# Patient Record
Sex: Female | Born: 1965 | Race: White | Hispanic: No | Marital: Married | Smoking: Never smoker
Health system: Southern US, Community
[De-identification: ages and names within clinical notes are randomized; demographics above are authoritative.]

## PROBLEM LIST (undated history)

## (undated) DIAGNOSIS — I10 Essential (primary) hypertension: Secondary | ICD-10-CM

## (undated) HISTORY — PX: TONSILLECTOMY: SUR1361

## (undated) HISTORY — PX: ABDOMINAL HYSTERECTOMY: SHX81

---

## 1999-03-11 ENCOUNTER — Other Ambulatory Visit: Admission: RE | Admit: 1999-03-11 | Discharge: 1999-03-11 | Payer: Self-pay | Admitting: Gynecology

## 2000-05-05 ENCOUNTER — Other Ambulatory Visit: Admission: RE | Admit: 2000-05-05 | Discharge: 2000-05-05 | Payer: Self-pay | Admitting: Gynecology

## 2001-08-23 ENCOUNTER — Other Ambulatory Visit: Admission: RE | Admit: 2001-08-23 | Discharge: 2001-08-23 | Payer: Self-pay | Admitting: Gynecology

## 2002-04-29 ENCOUNTER — Encounter: Payer: Self-pay | Admitting: General Surgery

## 2002-04-29 ENCOUNTER — Ambulatory Visit (HOSPITAL_COMMUNITY): Admission: RE | Admit: 2002-04-29 | Discharge: 2002-04-29 | Payer: Self-pay | Admitting: General Surgery

## 2002-05-04 ENCOUNTER — Encounter: Payer: Self-pay | Admitting: General Surgery

## 2002-05-04 ENCOUNTER — Encounter (HOSPITAL_COMMUNITY): Admission: RE | Admit: 2002-05-04 | Discharge: 2002-06-03 | Payer: Self-pay | Admitting: General Surgery

## 2002-08-25 ENCOUNTER — Other Ambulatory Visit: Admission: RE | Admit: 2002-08-25 | Discharge: 2002-08-25 | Payer: Self-pay | Admitting: Gynecology

## 2003-01-17 ENCOUNTER — Inpatient Hospital Stay (HOSPITAL_COMMUNITY): Admission: AD | Admit: 2003-01-17 | Discharge: 2003-01-18 | Payer: Self-pay | Admitting: Gynecology

## 2003-01-17 ENCOUNTER — Encounter (INDEPENDENT_AMBULATORY_CARE_PROVIDER_SITE_OTHER): Payer: Self-pay | Admitting: Specialist

## 2003-03-29 ENCOUNTER — Encounter: Payer: Self-pay | Admitting: Emergency Medicine

## 2003-03-29 ENCOUNTER — Emergency Department (HOSPITAL_COMMUNITY): Admission: EM | Admit: 2003-03-29 | Discharge: 2003-03-29 | Payer: Self-pay | Admitting: Emergency Medicine

## 2003-11-07 ENCOUNTER — Other Ambulatory Visit: Admission: RE | Admit: 2003-11-07 | Discharge: 2003-11-07 | Payer: Self-pay | Admitting: Gynecology

## 2005-01-03 ENCOUNTER — Other Ambulatory Visit: Admission: RE | Admit: 2005-01-03 | Discharge: 2005-01-03 | Payer: Self-pay | Admitting: Gynecology

## 2005-01-20 ENCOUNTER — Encounter (INDEPENDENT_AMBULATORY_CARE_PROVIDER_SITE_OTHER): Payer: Self-pay | Admitting: *Deleted

## 2005-01-20 ENCOUNTER — Inpatient Hospital Stay (HOSPITAL_COMMUNITY): Admission: RE | Admit: 2005-01-20 | Discharge: 2005-01-21 | Payer: Self-pay | Admitting: Obstetrics & Gynecology

## 2006-02-05 ENCOUNTER — Other Ambulatory Visit: Admission: RE | Admit: 2006-02-05 | Discharge: 2006-02-05 | Payer: Self-pay | Admitting: Gynecology

## 2006-04-15 ENCOUNTER — Encounter: Admission: RE | Admit: 2006-04-15 | Discharge: 2006-07-14 | Payer: Self-pay | Admitting: Specialist

## 2006-06-02 ENCOUNTER — Encounter: Admission: RE | Admit: 2006-06-02 | Discharge: 2006-06-02 | Payer: Self-pay | Admitting: Gynecology

## 2007-03-29 ENCOUNTER — Encounter: Admission: RE | Admit: 2007-03-29 | Discharge: 2007-03-29 | Payer: Self-pay | Admitting: Gynecology

## 2009-08-17 ENCOUNTER — Encounter: Admission: RE | Admit: 2009-08-17 | Discharge: 2009-08-17 | Payer: Self-pay | Admitting: Gynecology

## 2010-07-16 ENCOUNTER — Other Ambulatory Visit: Payer: Self-pay | Admitting: Gynecology

## 2010-07-16 DIAGNOSIS — Z1239 Encounter for other screening for malignant neoplasm of breast: Secondary | ICD-10-CM

## 2010-07-17 ENCOUNTER — Other Ambulatory Visit: Payer: Self-pay | Admitting: Gynecology

## 2010-08-19 ENCOUNTER — Ambulatory Visit
Admission: RE | Admit: 2010-08-19 | Discharge: 2010-08-19 | Disposition: A | Discharge status: Home / Self Care | Payer: BC Managed Care – PPO | Source: Ambulatory Visit | Attending: Gynecology | Admitting: Gynecology

## 2010-08-19 DIAGNOSIS — Z1239 Encounter for other screening for malignant neoplasm of breast: Secondary | ICD-10-CM

## 2010-11-01 NOTE — H&P (Signed)
[name], [name] NO.:  [id number]   MEDICAL RECORD NO.:  [id number]          PATIENT TYPE:  INP   LOCATION:  1606                         FACILITY:  [facility]   PHYSICIAN:  Gretta Cool, M.D. DATE OF BIRTH:  July 05, 1965   DATE OF ADMISSION:  01/20/2005  DATE OF DISCHARGE:                                HISTORY & PHYSICAL   CHIEF COMPLAINT:  Incapacitating cyclic pelvic pain.   HISTORY OF PRESENT ILLNESS:  [name] has a long history of pelvic  inflammatory disease with her initial surgery in February 1998. She  subsequently had laparotomy, lysis of adhesions and left salpingo-  oophorectomy and right oophorocystectomy and 2004. 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 She has had repeated episodes of painful  ovulatory cysts. She has had to remain on hormonal contraception to suppress  her ovarian function to be able to tolerate the pain. She now presents for  definitive therapy by hysterectomy, salpingo-oophorectomy for her  incapacitating cyclic pelvic pain.   PAST MEDICAL HISTORY:  Usual childhood disease without sequelae.   MEDICAL ILLNESSES:  1.  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  2.  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   PRESENT MEDICATIONS:  1.  Nuvaring for contraception.  2.  Zoloft 100 mg daily for depression and anxiety.  3.  Trazodone 100 mg for sleep disorder.  4.  Valtrex 500 milligrams daily for suppression of HSV.   FAMILY HISTORY:  Mother and father are both  living and well. Father has  diabetes and blood pressure elevations, one sister is living and well. No  other known familial tendency to disease.   REVIEW OF SYSTEMS:  HEENT:  Denies symptoms. CARDIORESPIRATORY:  Denies  asthma, cough, bronchitis, shortness of breath. GI/GU:  Denies frequency,  urgency, dysuria change in bowel habits, food  intolerance.   PHYSICAL EXAMINATION:  GENERAL:  Well-developed, well-nourished white female  moderately over ideal weight at 174, 5 feet, 4-1/4 inches tall.  HEENT:  Pupils are equal and reactive to light and accommodate. Fundi not  examined. Oropharynx clear.  NECK:  Supple without mass or thyroid enlargement.  BREASTS:  Without masses, nodes or nipple discharge.  PELVIC:  External genitalia normal female, vagina clean and rugose. Cervix  is nulliparous. The uterus is mid position, normal size, shape, contour.  There is thickening and tenderness bilaterally.   IMPRESSION:  1.  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  2.  History of severe pelvic inflammatory disease with multiple procedures      as above.  3.  Depression.  4.  History of herpes simplex virus 2.  5.  Sleep disorder.  RECOMMENDATIONS:  Recommended on to definitive therapy by laparotomy,  hysterectomy, possible salpingo-oophorectomy, lysis of extensive adhesions,  bowel prep preoperatively.       CWL/MEDQ  D:  01/20/2005  T:  01/20/2005  Job:  [id number]   cc:   Dr. Lucien Mons

## 2010-11-01 NOTE — Op Note (Signed)
[name], OCHELTREE NO.:  [id number]   MEDICAL RECORD NO.:  [id number]          PATIENT TYPE:  INP   LOCATION:  1606                         FACILITY:  [facility]   PHYSICIAN:  Gretta Cool, M.D. 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   PROCEDURE:  Hysterectomy, right salpingo-oophorectomy, lysis of extensive  adhesions.   SURGEON:  Gretta Cool, M.D.   ASSISTANT:  Phyllis Ginger, M.D.   ANESTHESIA:  General orotracheal.   DESCRIPTION OF PROCEDURE:  Under excellent general anesthesia with the  patient prepped and draped in supine position with her bladder drained by  Foley catheter a Pfannenstiel incision was made by excision of her previous  Pfannenstiel scar. The incision was then extended through the fascia and the  rectus muscles separated in the midline. Upon opening the peritoneal cavity  there was extensive adhesions of the omentum all over the anterior abdominal  wall. After extended dissection the omentum was removed from the anterior  abdominal wall, peritoneum, and from the right pelvic peritoneum, bladder  flap, and posterior aspect of the uterus. Once all of these adhesions were  lysed her right tube was found to be fairly free and mobile. Her left tube  and ovary had been previously removed. At this point, decision was made to  proceed with hysterectomy. The uterus was elevated with Kelly clamps, the  round ligaments transected, and the anterior leaf of the broad ligament was  opened. The bladder was pushed off the lower segment. The infundibulopelvic  vessels on the right were then skeletonized. The  vessels were then clamped,  cut, sutured and tied with 0 Vicryl. At this point the uterine vessels  bilaterally were skeletonized. The left infundibulum pelvic had been  previously removed. The uterine vessels were then clamped, cut, sutured, and  tied with 0 Vicryl. The upper portion of cardinal ligaments were then also  clamped, cut, sutured, and tied. At this point the cervix was incised and  the cervical tissue largely excised. What was intended to be a supracervical  hysterectomy eventually removed virtually all of the endocervical canal and  left the fascia of the cervix but removed the endocervical canal. At this  point the anterior and posterior cervical vaginal fascia was approximated  with a running suture of 0 Vicryl. The cervix and uterus were removed. The  pelvic floor was then reperitonealized with a running suture of 2-0  Monocryl. At this point the packs and retractors were removed. There was no  significant bleeding. The abdominal peritoneum was then closed with a  running suture of 0 Monocryl. The rectus muscles were then plicated in the  midline with the same suture of 0 Monocryl. At this point the fascia was  irrigated and then approximated with a running suture of 0 Vicryl from each  angle to the midline, subcutaneous tissues approximated with interrupted  sutures of 3-0 Vicryl, and the skin closed with skin staples and Steri-  Strips. At the end of the procedure, sponge and lap counts were correct,  there were no complications. The patient returned to the recovery room in  excellent condition.       CWL/MEDQ  D:  01/20/2005  T:  01/20/2005  Job:  [id number]   cc:   Dr. Alessandra Bevels, [place], or [place], [place]

## 2010-11-01 NOTE — H&P (Signed)
NAME:  [name], [name]                      ACCOUNT NO.:  [id number]   MEDICAL RECORD NO.:  [id number]                   PATIENT TYPE:  INP   LOCATION:  9399                                 FACILITY:  [facility]   PHYSICIAN:  Gretta Cool, M.D.              DATE OF BIRTH:  03-27-1966   DATE OF ADMISSION:  01/17/2003  DATE OF DISCHARGE:                                HISTORY & PHYSICAL   CHIEF COMPLAINT:  Incapacitating cyclic pelvic pain.   HISTORY OF PRESENT ILLNESS:  [name] is a 45 year old nulligravida  with a history of recurrent pelvic inflammatory disease with bilateral  tuboovarian involvement.  She has had the diagnosis of right hydrosalpinx  for several years dating to 2001 with a consistent finding of left tortuous  enlargement of her left fallopian tube with adjacent ovarian structure.  Intermittently, she has had evidence of a trapped ovary syndrome well  suppressed by oral contraceptives.  She has recently been switched to  transdermal by Nuvaring.  She continues to miss work every month and  continues to be incapacitated by cyclic pelvic pain, apparently from her  right hydrosalpinx.  She continues to hold out hope for spontaneous  pregnancy.  We have discussed that any high hope of pregnancy would involve,  at best, reproductive technology with IVF.  She wishes to proceed with  definitive therapy to improve her comfort, but not with definitive therapy  such that she would not have an option of fertility.  At some time in the  future, she understands the remote possibility by other than IVF.  She also  has significant depression and sleep disorder, on medication as above with  trazodone and Zoloft.   PAST MEDICAL HISTORY:  Usual childhood disease without sequelae medical  illnesses, incapacitating cyclic pelvic pains.  Previous history of  exploratory laparotomy, lysis of adhesions February 1998 and repeat  diagnostic laparoscopy second look, lysis of  adhesions also May 1998.  She  has had repeated bouts of pelvic inflammatory disease prior to the above  procedures.   MEDICATIONS:  1. Zoloft 125 mg daily.  2. Trazodone 100 mg q.h.s. for sleep.  3. Xanax 0.5 as needed for anxiety.  4. __________ for suppression of follicular activity and cyclic pain.   FAMILY HISTORY:  Father is living with diabetes and hypertension.  Mother is  living and well.  Paternal grandmother had melanoma.  Maternal grandfather  died of stroke.  No other known familial tendency.   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   REVIEW OF SYSTEMS:  HEENT:  Denies symptoms.  RESPIRATORY:  Denies asthma,  cough, or shortness of breath.  GI/GU:  Denies frequency, urgency, dysuria,  change in bowel habits.  Abdominal discomfort, cyclic in nature as above.   PHYSICAL EXAMINATION:  GENERAL APPEARANCE:  Well-developed, well-nourished  white female, moderately over ideal weight at  165, 5 feet 4 inches, blood  pressure 90/60.  HEENT:  Pupils equal, reactive to light and accomodation.  Fundi not  examined.  Oropharynx clear.  NECK:  Supple without mass.  Thyroid enlarged.  CHEST:  Clear to P&A.  HEART:  Regular rhythm without murmurs.  BREASTS:  Without mass nodes or nipple discharge.  ABDOMEN:  Soft, scaphoid without mass or organomegaly.  PELVIC:  External genitalia, normal female, vagina clean and rugose.  The  cervix is nulliparous.  The uterus is mid position.  ON the right, there is  thickening and fullness, exquisite tenderness and a maximal enlargement.  On  the left, there is thickening and discomfort compatible with the ultrasound  findings noted above.  RECTOVAGINAL:  Confirmed.  EXTREMITIES:  Negative.  NEUROLOGICAL:  Physiologic.   IMPRESSION:  1. Pelvic inflammatory disease with right hydrosalpinx and recurrent     incapacitating cyclic pelvic pain.  2. Trapped ovary syndrome.  3. Depression on  multiple agent therapy.  4. Sleep disorder, on trazodone.   RECOMMENDATIONS:  I have recommended on to laparotomy and salpingo-  oophorectomy, lysis of adhesions in so far as possible.  Possible salpingo-  oophorectomy if the ovaries cannot be salvaged without recurring pain.  She  understands all of the plans and alternative therapies.                                               Gretta Cool, M.D.    CWL/MEDQ  D:  01/16/2003  T:  01/17/2003  Job:  [id number]   cc:   Pollyann Samples, M.D.  [place], [place]

## 2010-11-01 NOTE — Discharge Summary (Signed)
NAME:  [name], Petersen                      ACCOUNT NO.:  [id number]   MEDICAL RECORD NO.:  [id number]                   PATIENT TYPE:  INP   LOCATION:  9137                                 FACILITY:  [facility]   PHYSICIAN:  Gretta Cool, M.D.              DATE OF BIRTH:  1965/10/21   DATE OF ADMISSION:  01/17/2003  DATE OF DISCHARGE:  01/18/2003                                 DISCHARGE SUMMARY   HISTORY OF PRESENT ILLNESS:  [name] is a 45 year old female,  nulligravida with a history of recurrent pelvic inflammatory disease with  bilateral tuboovarian involvement.  She had a diagnosis of right  hydrosalpinx for several years dating to 2001 with consistent findings of a  left tortuous enlargement of her left fallopian tube with an adjacent  ovarian structure.  She also has evidence of trapped ovarian syndrome that  is well suppressed by oral contraceptives.  Recently, she has been switched  to transdermal contraceptive with NuvaRing.  She continues to report  incapacitating cyclic pelvic pain which is causing her to miss work.  It is  felt that this is from her right hydrosalpinx.  She continues to have hope  of a spontaneous pregnancy.  It has been discussed with the patient that it  would be best that she proceed with reproductive technology with IVF.  She  wishes at this point to proceed with definitive therapy to improve comfort  but not with definitive therapy such that she would not have the option of  fertility.  It is also noted that she has significant depression and sleep  disorder which she is being treated with trazodone and Zoloft.   ADMISSION PHYSICAL EXAMINATION:  VITAL SIGNS:  A well-developed, well-  nourished female moderately over ideal weight at 165 pounds, 5 feet 4  inches, blood pressure is 90/60.  CHEST:  Clear to A&P.  HEART:  Rate and rhythm are regular without murmur, gallop, or cardiac  enlargement.  BREASTS:  Without masses noted, no nipple  discharge.  ABDOMEN:  Soft and scaphoid without masses or organomegaly.  PELVIC:  External genitalia within normal limits for female.  Vagina clean  and rugosed.  Cervix is nulliparous.  Uterus is mid position.  On the right  there is thickening and fullness, exquisite tenderness, and maximum  enlargement.  On the left there is thickening and discomfort compatible with  the ultrasound findings.  Rectovaginal exam confirms.   IMPRESSION:  1. Pelvic inflammatory disease with right hydrosalpinx and recurrent     incapacitating cyclic pelvic pain.  2. Trapped ovary syndrome.  3. Depression on multiple agent therapy.  4. Sleep disorder on trazodone.   PLAN:  It has been recommended to the patient to proceed with laparotomy and  salpingo-oophorectomy, lysis of adhesions as much as possible.  Possible  salpingo-oophorectomy if the ovaries cannot be salvaged.  The patient  understands the plan and accepts these procedures.  LABORATORY DATA:  1. Pathology report showed right ovary, a cyst benign ovarian stroma with     benign fibromembranous cyst.  2. Left ovary and fallopian tube benign ovary with focus of endometriosis     and benign cystic follicles, benign fallopian tube with benign paratubal     cysts and fibrous adhesions.   HOSPITAL COURSE:  The patient underwent exploratory laparotomy, right  ovarian cystectomy, lysis of dense ovarian adhesions, and excision of left  trapped ovary under general anesthesia.  The procedures were completed  without any complications and the patient was returned to the recovery room  in excellent condition.  Postoperative course was without complications and  the patient was discharged on the first postoperative day in excellent  condition.   FINAL DISCHARGE INSTRUCTIONS:  Included no heavy lifting or straining, no  vaginal entrance, and to increase ambulation as tolerated.  She is to call  for any fever over 100.5 or failure of daily  improvement.   DIET:  Regular.   MEDICATIONS:  1. Dilaudid 2 mg p.o. q.4 h. p.r.n. discomfort.  2. She can return to her preoperative medications.   She is to return to the office in two weeks for followup.   CONDITION ON DISCHARGE:  Excellent.   FINAL DISCHARGE DIAGNOSES:  1. 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  2. Extensive left ovarian adhesions with severe trapped ovary syndrome with     tubal and ovarian adhesions.   PROCEDURES PERFORMED:  Exploratory laparotomy, right ovarian cystectomy,  lysis of dense ovarian adhesions, and excision of left trapped ovary under  general anesthesia.     Matt Holmes, N.P.                          Gretta Cool, M.D.    EMK/MEDQ  D:  04/06/2003  T:  04/06/2003  Job:  [id number]

## 2010-11-01 NOTE — Discharge Summary (Signed)
NAMEANALLELY, ROSELL NO.:  [id number]   MEDICAL RECORD NO.:  [id number]          PATIENT TYPE:  INP   LOCATION:  1606                         [facility]ACILITY:  [facility]   PHYSICIAN:  Gretta Cool, M.D. DATE OF BIRTH:  1965/10/07   DATE OF ADMISSION:  01/20/2005  DATE OF DISCHARGE:  01/21/2005                                 DISCHARGE SUMMARY   HISTORY OF PRESENT ILLNESS:  Ms. Mink has had a long history of pelvic  inflammatory disease with her initial surgery in February of 1998. She  subsequently had laparotomy, lysis of adhesions and left salpingo-  oophorectomy and right oophorocystectomy in 2004. She has continued to have  incapacitating cyclic pelvic pain with recurring adnexal masses thought to  be related to trapped ovarian syndrome. She has had repeated episodes of  painful ovulatory cysts. She has had to remain on hormonal contraceptive to  suppress her ovarian function to be able to tolerate the pain. She now  presents for definitive therapy by hysterectomy, salpingo-oophorectomy for  incapacitating cyclic pelvic pain.   ADMISSION EXAM:  CHEST:  Clear to A&P.  HEART:  Rate and rhythm were regular without murmur, gallop or cardiac  enlargement.  ABDOMEN:  Soft without masses or organomegaly.  PELVIC:  External genitalia within normal limits for female. Vagina clean  and rugose. Cervix is nulliparous and clean. The uterus is mid position,  normal shape, size and contour. There is thickening and tenderness  bilaterally.   IMPRESSION:  1.  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  2.  History of severe pelvic inflammatory disease with multiple procedures.  3.  Depression.  4.  History of HSV type 2.  5.  Sleep disorder.   PLAN:  Definitive therapy by laparotomy, hysterectomy, possible salpingo-  oophorectomy, lysis of extensive adhesions. Will have bowel prepped  preoperatively. The risks and  benefits have been discussed with the patient  and she accepts these procedures.   LABORATORY DATA:  Admission hemoglobin 13.8, hematocrit 40.2. On the first  postoperative day, hemoglobin was 13.1, hematocrit 37.8.   HOSPITAL COURSE:  The patient underwent hysterectomy, right salpingo-  oophorectomy, lysis of extensive adhesions under general anesthesia. The  procedures were completed without any complication and the patient was  returned to the recovery room in excellent condition.   PATHOLOGY REPORT:  Cervix marked acute cervicitis. Endometrium, secretory  endometrium, no evidence of hyperplasia, malignancy or features of polyp.  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  Unremarkable fallopian tube and ovary with cystic follicles. Her  postoperative course was without complications and she was discharged on the  second postoperative day in excellent condition.   FINAL DISCHARGE INSTRUCTIONS:  No heavy lifting or straining, no vaginal  entrance, and increase her daily activities as tolerated.   MEDICATIONS:  1.  Celebrex 200 mg daily x4.  2.  Darvocet-N 100, 1 p.o. q.4 h p.r.n. discomfort.   DIET:  Regular.   She is to return to the office in one  week for followup.   CONDITION ON DISCHARGE:  Excellent.   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   PROCEDURE PERFORMED:  Hysterectomy, right salpingo-oophorectomy, lysis of  extensive adhesions under general anesthesia.      Matt Holmes, N.P.    ______________________________  Gretta Cool, M.D.    EMK/MEDQ  D:  03/19/2005  T:  03/19/2005  Job:  [id number]

## 2010-11-01 NOTE — Op Note (Signed)
NAME:  [name], [name]                      ACCOUNT NO.:  [id number]   MEDICAL RECORD NO.:  [id number]                   PATIENT TYPE:  INP   LOCATION:  9137                                 FACILITY:  [facility]   PHYSICIAN:  Gretta Cool, M.D.              DATE OF BIRTH:  October 08, 1965   DATE OF PROCEDURE:  01/17/2003  DATE OF DISCHARGE:                                 OPERATIVE REPORT   PREOPERATIVE DIAGNOSES:  1. Right adnexal mass with incapacitating cyclic pain.  2. Bilateral trapped ovary syndrome with history of recurrent pelvic     inflammatory disease.   POSTOPERATIVE DIAGNOSES:  1. 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  2. Extensive left ovarian adhesions with severe trapped ovary syndrome, with     tubal and ovarian adhesions.   PROCEDURES:  1. Exploratory laparotomy.  2. Right ovarian cystectomy.  3. Lysis of dense ovarian adhesions.  4. Excision of left trapped ovary.   ANESTHESIA:  General orotracheal.   SURGEON:  Gretta Cool, M.D.   ASSISTANT:  Raynald Kemp, M.D.   DESCRIPTION OF PROCEDURE:  Under excellent general anesthesia, the patient  prepped and draped in lithotomy position.  A Pfannenstiel incision was made  and extended through the fascia.  The rectus muscles were separated in the  midline and the abdomen explored.  No abnormalities identified in the upper  abdomen.  Examination of the pelvis revealed a huge cystic mass on the right  that has been followed for years by ultrasound and stable over time.  Her  left ovary was densely adherent to omentum and to the posterior aspect of  the uterus.  The fallopian tube was also adherent in those adhesions,  adherent to the same structures.  The procedure was begun by ovarian  cystectomy on the right.  The capsule was opened and the mass shelled out of  the ovary.  It is possible that the mass had been intermittently torsing,  resulting in ischemia of the ovary because of its very  small pedicle.  The  cyst was evacuated and blood supply cauterized at the base.  Bleeding points  were individually cauterized.  At this point the capsule of the ovary was  closed deep with 4-0 Vicryl suture and a subcuticular Vicryl suture, also a  4-0 Vicryl.  At this point all the bleeding was well-controlled.  There were  no significant adhesions of the right fallopian tube or ovary.  See the  previous description of lysis of adhesions on both ovaries and tubes.  On  the left because of the severity of adhesions and completely occluded  ovarian surface, it was felt that the ovary would again be a source of pain  intolerable to the patient and that it had no functional value.  At this  point a decision was made to remove the tube and ovary on the left.  The  infundibulopelvic vessels were skeletonized,  the vessels clamped, cut,  sutured, and tied with 0 Vicryl, and a free tie was used to doubly ligate  the pedicle.  The ovarian ligament was also clamped, cut, sutured, and tied  with 0 Vicryl.  At this point the round ligament was pulled around and  secured over the pedicle so as to prevent adhesion formation.  At this point  the pelvis was irrigated and all the incisions re-examined.  There was no  bleeding from any site.  The packs were removed, the pelvis irrigated once  again, and the abdominal peritoneum closed with running suture of 0  Monocryl.  The rectus muscles were also plicated in the midline with 0  Monocryl.  At this point the fascia was approximated from each angle to the  midline with a running suture of 0 Vicryl.  The subcutaneous tissue was  approximated with interrupted sutures of 3-0 Vicryl and the skin closed with  skin staples and Steri-Strips.  At the end of the procedure sponge and lap  counts were correct.  No complications.  The patient returned to the  recovery room in excellent condition.                                                Gretta Cool,  M.D.    CWL/MEDQ  D:  01/17/2003  T:  01/17/2003  Job:  [id number]

## 2011-09-15 ENCOUNTER — Other Ambulatory Visit: Payer: Self-pay | Admitting: Gynecology

## 2012-06-28 ENCOUNTER — Other Ambulatory Visit: Payer: Self-pay | Admitting: Gynecology

## 2012-10-20 ENCOUNTER — Other Ambulatory Visit: Payer: Self-pay

## 2013-03-30 ENCOUNTER — Other Ambulatory Visit: Payer: Self-pay

## 2013-03-30 DIAGNOSIS — Z1231 Encounter for screening mammogram for malignant neoplasm of breast: Secondary | ICD-10-CM

## 2013-04-25 ENCOUNTER — Ambulatory Visit
Admission: RE | Admit: 2013-04-25 | Discharge: 2013-04-25 | Disposition: A | Discharge status: Home / Self Care | Payer: BC Managed Care – PPO | Source: Ambulatory Visit

## 2013-04-25 DIAGNOSIS — Z1231 Encounter for screening mammogram for malignant neoplasm of breast: Secondary | ICD-10-CM

## 2014-12-11 ENCOUNTER — Other Ambulatory Visit: Payer: Self-pay

## 2014-12-11 DIAGNOSIS — Z1231 Encounter for screening mammogram for malignant neoplasm of breast: Secondary | ICD-10-CM

## 2015-01-11 ENCOUNTER — Ambulatory Visit
Admission: RE | Admit: 2015-01-11 | Discharge: 2015-01-11 | Disposition: A | Discharge status: Home / Self Care | Payer: BLUE CROSS/BLUE SHIELD | Source: Ambulatory Visit

## 2015-01-11 DIAGNOSIS — Z1231 Encounter for screening mammogram for malignant neoplasm of breast: Secondary | ICD-10-CM

## 2016-11-04 ENCOUNTER — Other Ambulatory Visit: Payer: Self-pay | Admitting: Obstetrics and Gynecology

## 2016-11-04 DIAGNOSIS — Z1231 Encounter for screening mammogram for malignant neoplasm of breast: Secondary | ICD-10-CM

## 2016-11-17 ENCOUNTER — Ambulatory Visit
Admission: RE | Admit: 2016-11-17 | Discharge: 2016-11-17 | Disposition: A | Discharge status: Home / Self Care | Payer: BLUE CROSS/BLUE SHIELD | Source: Ambulatory Visit | Attending: Obstetrics and Gynecology | Admitting: Obstetrics and Gynecology

## 2016-11-17 ENCOUNTER — Encounter: Payer: Self-pay | Admitting: Radiology

## 2016-11-17 DIAGNOSIS — Z1231 Encounter for screening mammogram for malignant neoplasm of breast: Secondary | ICD-10-CM

## 2017-04-20 DIAGNOSIS — L638 Other alopecia areata: Secondary | ICD-10-CM | POA: Diagnosis not present

## 2017-04-20 DIAGNOSIS — L710 Perioral dermatitis: Secondary | ICD-10-CM | POA: Diagnosis not present

## 2017-06-11 DIAGNOSIS — L638 Other alopecia areata: Secondary | ICD-10-CM | POA: Diagnosis not present

## 2017-07-03 ENCOUNTER — Emergency Department (HOSPITAL_COMMUNITY)
Admission: EM | Admit: 2017-07-03 | Discharge: 2017-07-03 | Disposition: A | Discharge status: Home / Self Care | Payer: BLUE CROSS/BLUE SHIELD | Attending: Emergency Medicine | Admitting: Emergency Medicine

## 2017-07-03 ENCOUNTER — Encounter (HOSPITAL_COMMUNITY): Payer: Self-pay | Admitting: Emergency Medicine

## 2017-07-03 ENCOUNTER — Emergency Department (HOSPITAL_COMMUNITY): Payer: BLUE CROSS/BLUE SHIELD

## 2017-07-03 DIAGNOSIS — Z79899 Other long term (current) drug therapy: Secondary | ICD-10-CM | POA: Insufficient documentation

## 2017-07-03 DIAGNOSIS — B009 Herpesviral infection, unspecified: Secondary | ICD-10-CM

## 2017-07-03 DIAGNOSIS — R110 Nausea: Secondary | ICD-10-CM | POA: Diagnosis not present

## 2017-07-03 DIAGNOSIS — K529 Noninfective gastroenteritis and colitis, unspecified: Secondary | ICD-10-CM

## 2017-07-03 DIAGNOSIS — I10 Essential (primary) hypertension: Secondary | ICD-10-CM | POA: Diagnosis not present

## 2017-07-03 DIAGNOSIS — R109 Unspecified abdominal pain: Secondary | ICD-10-CM | POA: Diagnosis not present

## 2017-07-03 DIAGNOSIS — R1030 Lower abdominal pain, unspecified: Secondary | ICD-10-CM | POA: Diagnosis not present

## 2017-07-03 DIAGNOSIS — R197 Diarrhea, unspecified: Secondary | ICD-10-CM | POA: Diagnosis not present

## 2017-07-03 HISTORY — DX: Essential (primary) hypertension: I10

## 2017-07-03 LAB — CBC WITH DIFFERENTIAL/PLATELET
Basophils Absolute: 0 10*3/uL (ref 0.0–0.1)
Basophils Relative: 0 %
EOS ABS: 0 10*3/uL (ref 0.0–0.7)
Eosinophils Relative: 0 %
HEMATOCRIT: 44.4 % (ref 36.0–46.0)
HEMOGLOBIN: 14.6 g/dL (ref 12.0–15.0)
LYMPHS ABS: 2.7 10*3/uL (ref 0.7–4.0)
LYMPHS PCT: 23 %
MCH: 30.2 pg (ref 26.0–34.0)
MCHC: 32.9 g/dL (ref 30.0–36.0)
MCV: 91.9 fL (ref 78.0–100.0)
MONOS PCT: 5 %
Monocytes Absolute: 0.6 10*3/uL (ref 0.1–1.0)
NEUTROS ABS: 8.3 10*3/uL — AB (ref 1.7–7.7)
NEUTROS PCT: 72 %
Platelets: 305 10*3/uL (ref 150–400)
RBC: 4.83 MIL/uL (ref 3.87–5.11)
RDW: 12.1 % (ref 11.5–15.5)
WBC: 11.7 10*3/uL — AB (ref 4.0–10.5)

## 2017-07-03 LAB — COMPREHENSIVE METABOLIC PANEL
ALBUMIN: 4.6 g/dL (ref 3.5–5.0)
ALK PHOS: 46 U/L (ref 38–126)
ALT: 18 U/L (ref 14–54)
ANION GAP: 12 (ref 5–15)
AST: 47 U/L — ABNORMAL HIGH (ref 15–41)
BUN: 15 mg/dL (ref 6–20)
CALCIUM: 9.4 mg/dL (ref 8.9–10.3)
CHLORIDE: 102 mmol/L (ref 101–111)
CO2: 26 mmol/L (ref 22–32)
Creatinine, Ser: 0.81 mg/dL (ref 0.44–1.00)
GFR calc non Af Amer: >60 mL/min (ref >60)
GLUCOSE: 98 mg/dL (ref 65–99)
Potassium: 3.4 mmol/L — ABNORMAL LOW (ref 3.5–5.1)
SODIUM: 140 mmol/L (ref 135–145)
Total Bilirubin: 0.8 mg/dL (ref 0.3–1.2)
Total Protein: 7.3 g/dL (ref 6.5–8.1)

## 2017-07-03 LAB — URINALYSIS, ROUTINE W REFLEX MICROSCOPIC
BILIRUBIN URINE: NEGATIVE
Glucose, UA: NEGATIVE mg/dL
HGB URINE DIPSTICK: NEGATIVE
Ketones, ur: NEGATIVE mg/dL
Leukocytes, UA: NEGATIVE
Nitrite: NEGATIVE
PH: 6 (ref 5.0–8.0)
Protein, ur: NEGATIVE mg/dL
SPECIFIC GRAVITY, URINE: 1.02 (ref 1.005–1.030)

## 2017-07-03 LAB — LIPASE, BLOOD: Lipase: 32 U/L (ref 11–51)

## 2017-07-03 LAB — POC OCCULT BLOOD, ED: FECAL OCCULT BLD: POSITIVE — AB

## 2017-07-03 MED ORDER — MORPHINE SULFATE (PF) 4 MG/ML IV SOLN
4.0000 mg | Freq: Once | INTRAVENOUS | Status: DC
  Filled 2017-07-03: qty 1

## 2017-07-03 MED ORDER — CIPROFLOXACIN HCL 500 MG PO TABS: 500.0000 mg | ORAL_TABLET | Freq: Two times a day (BID) | ORAL | 0 refills | Status: DC

## 2017-07-03 MED ORDER — METRONIDAZOLE 500 MG PO TABS: 500.0000 mg | ORAL_TABLET | Freq: Two times a day (BID) | ORAL | 0 refills | Status: DC

## 2017-07-03 MED ORDER — VALACYCLOVIR HCL 500 MG PO TABS
500.0000 mg | ORAL_TABLET | Freq: Once | ORAL | Status: AC
  Administered 2017-07-03: 500 mg via ORAL
  Filled 2017-07-03: qty 1

## 2017-07-03 MED ORDER — METRONIDAZOLE 500 MG PO TABS
500.0000 mg | ORAL_TABLET | Freq: Once | ORAL | Status: AC
  Administered 2017-07-03: 500 mg via ORAL
  Filled 2017-07-03: qty 1

## 2017-07-03 MED ORDER — VALACYCLOVIR HCL 500 MG PO TABS: 500.0000 mg | ORAL_TABLET | Freq: Two times a day (BID) | ORAL | 0 refills | Status: AC

## 2017-07-03 MED ORDER — ONDANSETRON HCL 4 MG/2ML IJ SOLN
4.0000 mg | Freq: Once | INTRAMUSCULAR | Status: AC
  Administered 2017-07-03: 4 mg via INTRAVENOUS
  Filled 2017-07-03: qty 2

## 2017-07-03 MED ORDER — TRAMADOL HCL 50 MG PO TABS: 50.0000 mg | ORAL_TABLET | Freq: Four times a day (QID) | ORAL | 0 refills | Status: DC | PRN

## 2017-07-03 MED ORDER — TRAMADOL HCL 50 MG PO TABS
50.0000 mg | ORAL_TABLET | Freq: Once | ORAL | Status: AC
  Administered 2017-07-03: 50 mg via ORAL
  Filled 2017-07-03: qty 1

## 2017-07-03 MED ORDER — CIPROFLOXACIN HCL 250 MG PO TABS
500.0000 mg | ORAL_TABLET | Freq: Once | ORAL | Status: AC
  Administered 2017-07-03: 500 mg via ORAL
  Filled 2017-07-03: qty 2

## 2017-07-03 MED ORDER — IOPAMIDOL (ISOVUE-300) INJECTION 61%
100.0000 mL | Freq: Once | INTRAVENOUS | Status: AC | PRN
  Administered 2017-07-03: 100 mL via INTRAVENOUS

## 2017-07-03 NOTE — ED Triage Notes (Signed)
Pt states she woke up at 10pm with diarrhea and abd pain/lower back pain.  States the diarrhea became bloody around 5am.  Nausea with no vomiting.

## 2017-07-03 NOTE — ED Provider Notes (Signed)
[facility] EMERGENCY DEPARTMENT Provider Note   CSN: [id number] Arrival date & time: 07/03/17  0725     History   Chief Complaint Chief Complaint  Patient presents with  . Diarrhea    HPI [name] is a 52 y.o. female with a history of hypertension and surgical history significant for abdominal hysterectomy presenting with a 10-hour history of diarrhea which has progressed to include blood-tinged stool.  She reports approximately 8 episodes of diarrhea starting around 8 PM last night.  In addition she has lower abdominal cramping pain which waxes and wanes, improved some after bowel movement.  Her last movement was about 1/2 hours ago and she reports seeing bright red blood at the bottom of the toilet bowl with this movement.  She denies fevers or chills, dizziness or weakness, she felt nauseated earlier but not currently.  She denies history of hemorrhoids, has not undergone screening colonoscopy yet. Denies foreign travel or recent abx use.   No treatments prior to arrival.  The history is provided by the patient.    Past Medical History:  Diagnosis Date  . Hypertension     There are no active problems to display for this patient.   Past Surgical History:  Procedure Laterality Date  . ABDOMINAL HYSTERECTOMY    . TONSILLECTOMY      OB History    No data available       Home Medications    Prior to Admission medications   Medication Sig Start Date End Date Taking? Authorizing Provider  ALPRAZolam Prudy Feeler) 0.5 MG tablet Take 0.5 mg by mouth 2 (two) times daily.   Yes [provider]  benazepril-hydrochlorthiazide (LOTENSIN HCT) 20-12.5 MG tablet Take 1 tablet by mouth daily.   Yes [provider]  clobetasol (TEMOVATE) 0.05 % external solution Apply 1 application topically 2 (two) times daily.   Yes [provider]  ciprofloxacin (CIPRO) 500 MG tablet Take 1 tablet (500 mg total) by mouth every 12 (twelve) hours. 07/03/17   Burgess Amor, PA-C  metroNIDAZOLE (FLAGYL) 500 MG tablet Take 1 tablet (500 mg total) by mouth 2 (two) times daily. 07/03/17   Burgess Amor, PA-C  traMADol (ULTRAM) 50 MG tablet Take 1 tablet (50 mg total) by mouth every 6 (six) hours as needed for moderate pain. 07/03/17   Elynor Kallenberger, Raynelle Fanning, PA-C  valACYclovir (VALTREX) 500 MG tablet Take 1 tablet (500 mg total) by mouth 2 (two) times daily for 5 days. 07/03/17 07/08/17  Burgess Amor, PA-C    Family History History reviewed. No pertinent family history.  Social History Social History   Tobacco Use  . Smoking status: Never Smoker  Substance Use Topics  . Alcohol use: No    Frequency: Never  . Drug use: No     Allergies   Morphine and Sulfa antibiotics   Review of Systems Review of Systems  Constitutional: Negative for fever.  HENT: Negative for congestion and sore throat.   Eyes: Negative.   Respiratory: Negative for chest tightness and shortness of breath.   Cardiovascular: Negative for chest pain.  Gastrointestinal: Positive for abdominal pain, blood in stool, diarrhea and nausea.  Genitourinary: Negative.   Musculoskeletal: Negative for arthralgias, joint swelling and neck pain.  Skin: Negative.  Negative for rash and wound.  Neurological: Negative for dizziness, weakness, light-headedness, numbness and headaches.  Psychiatric/Behavioral: Negative.      Physical Exam Updated Vital Signs BP 132/73 (BP Location: Right Arm)   Pulse 63   Temp  97.7 F (36.5 C) (Oral)   Resp 18   Ht 5\' 3"  (1.6 m)   Wt 72.1 kg (159 lb)   SpO2 100%   BMI 28.17 kg/m   Physical Exam  Constitutional: She appears well-developed and well-nourished.  HENT:  Head: Normocephalic and atraumatic.  Eyes: Conjunctivae are normal.  Neck: Normal range of motion.  Cardiovascular: Normal rate, regular rhythm, normal heart sounds and intact distal pulses.  Pulmonary/Chest: Effort normal and breath sounds normal. She has no wheezes.  Abdominal: Soft. Bowel sounds  are decreased. There is tenderness in the suprapubic area and left lower quadrant. There is no rigidity, no rebound and no guarding.  Genitourinary: Rectal exam shows guaiac positive stool.  Genitourinary Comments: Strongly hemoccult positive.  Chaperone present Banker)  Musculoskeletal: Normal range of motion.  Neurological: She is alert.  Skin: Skin is warm and dry.  Psychiatric: She has a normal mood and affect.  Nursing note and vitals reviewed.    ED Treatments / Results  Labs (all labs ordered are listed, but only abnormal results are displayed) Labs Reviewed  COMPREHENSIVE METABOLIC PANEL - Abnormal; Notable for the following components:      Result Value   Potassium 3.4 (*)    AST 47 (*)    All other components within normal limits  CBC WITH DIFFERENTIAL/PLATELET - Abnormal; Notable for the following components:   WBC 11.7 (*)    Neutro Abs 8.3 (*)    All other components within normal limits  URINALYSIS, ROUTINE W REFLEX MICROSCOPIC - Abnormal; Notable for the following components:   Color, Urine COLORLESS (*)    All other components within normal limits  POC OCCULT BLOOD, ED - Abnormal; Notable for the following components:   Fecal Occult Bld POSITIVE (*)    All other components within normal limits  LIPASE, BLOOD  OCCULT BLOOD X 1 CARD TO LAB, STOOL    EKG  EKG Interpretation None       Radiology Ct Abdomen Pelvis W Contrast  Result Date: 07/03/2017 CLINICAL DATA:  Bloody diarrhea and lower abdominal pain. EXAM: CT ABDOMEN AND PELVIS WITH CONTRAST TECHNIQUE: Multidetector CT imaging of the abdomen and pelvis was performed using the standard protocol following bolus administration of intravenous contrast. CONTRAST:  ISOVUE-300 IOPAMIDOL (ISOVUE-300) INJECTION 61% COMPARISON:  None. FINDINGS: Lower chest: No acute abnormality. Hepatobiliary: Subcentimeter low-density lesion in the right liver is too small to characterize. Focal fat along the falciform ligament.  Gallbladder is unremarkable. No biliary dilatation. Pancreas: Unremarkable. No pancreatic ductal dilatation or surrounding inflammatory changes. Spleen: Normal in size without focal abnormality. Adrenals/Urinary Tract: Adrenal glands are unremarkable. Kidneys are normal, without renal calculi, focal lesion, or hydronephrosis. Bladder is unremarkable. Stomach/Bowel: Moderate circumferential wall thickening of the descending colon with minimal surrounding inflammatory changes. The stomach and small bowel are unremarkable. No obstruction. Normal appendix. Vascular/Lymphatic: No significant vascular findings are present. No enlarged abdominal or pelvic lymph nodes. Reproductive: Status post hysterectomy. No adnexal masses. Other: No free fluid or pneumoperitoneum. Musculoskeletal: No acute or significant osseous findings. Mild levoscoliosis of the lumbar spine. IMPRESSION: 1. Moderate circumferential wall thickening of the descending colon, consistent with colitis. No colonic diverticula. Electronically Signed   By: Obie Dredge M.D.   On: 07/03/2017 11:30    Procedures Procedures (including critical care time)  Medications Ordered in ED Medications  morphine 4 MG/ML injection 4 mg (4 mg Intravenous Refused 07/03/17 0901)  metroNIDAZOLE (FLAGYL) tablet 500 mg (not administered)  ondansetron (ZOFRAN) injection  4 mg (4 mg Intravenous Given 07/03/17 0858)  valACYclovir (VALTREX) tablet 500 mg (500 mg Oral Given 07/03/17 1217)  iopamidol (ISOVUE-300) 61 % injection 100 mL (100 mLs Intravenous Contrast Given 07/03/17 1113)  traMADol (ULTRAM) tablet 50 mg (50 mg Oral Given 07/03/17 1219)  ciprofloxacin (CIPRO) tablet 500 mg (500 mg Oral Given 07/03/17 1219)     Initial Impression / Assessment and Plan / ED Course  I have reviewed the triage vital signs and the nursing notes.  Pertinent labs & imaging results that were available during my care of the patient were reviewed by me and considered in my medical  decision making (see chart for details).     During ed visit, pt pointed out a small patch of rash on her left buttock that has been diagnosed by her gyn as a herpes.  Last flare approx 1.5 years ago, now has had 2 back to back flares since Christmas.  Out of valtrex. Has asked for refill prescription.  Given dose here and script.  Labs and imaging reviewed and discussed with patient.  Patient had one small bowel movement while here, with small amount of bright red blood.  She was stable at time of discharge.  Will be placed on Cipro and Flagyl cautions discussed.  Referred to GI for evaluation.  Patient discussed with Dr. Ranae PalmsYelverton prior to discharge home.   Final Clinical Impressions(s) / ED Diagnoses   Final diagnoses:  Herpes  Colitis    ED Discharge Orders        Ordered    ciprofloxacin (CIPRO) 500 MG tablet  Every 12 hours     07/03/17 1215    metroNIDAZOLE (FLAGYL) 500 MG tablet  2 times daily     07/03/17 1215    valACYclovir (VALTREX) 500 MG tablet  2 times daily     07/03/17 1215    traMADol (ULTRAM) 50 MG tablet  Every 6 hours PRN     07/03/17 1215       Burgess Amordol, Makailee, PA-C 07/03/17 1220    Loren RacerYelverton, David, MD 07/03/17 1439

## 2017-07-03 NOTE — ED Notes (Signed)
ED Provider at bedside. 

## 2017-07-03 NOTE — Discharge Instructions (Signed)
Take the entire course of the antibiotics prescribed.  You have also been prescribed Valtrex at your request for your current outbreak.  Maintain a clear liquid diet for the next 24 hours, you may then increase your dietary choices if your symptoms allow.  Do not take any antidiarrheal medications at this time.  Return here as discussed if your symptoms worsen.

## 2017-07-20 DIAGNOSIS — Z6828 Body mass index (BMI) 28.0-28.9, adult: Secondary | ICD-10-CM | POA: Diagnosis not present

## 2017-07-20 DIAGNOSIS — I10 Essential (primary) hypertension: Secondary | ICD-10-CM | POA: Diagnosis not present

## 2017-07-20 DIAGNOSIS — F411 Generalized anxiety disorder: Secondary | ICD-10-CM | POA: Diagnosis not present

## 2017-09-07 DIAGNOSIS — H6123 Impacted cerumen, bilateral: Secondary | ICD-10-CM | POA: Diagnosis not present

## 2017-09-07 DIAGNOSIS — R42 Dizziness and giddiness: Secondary | ICD-10-CM | POA: Diagnosis not present

## 2017-09-07 DIAGNOSIS — H8111 Benign paroxysmal vertigo, right ear: Secondary | ICD-10-CM | POA: Diagnosis not present

## 2017-09-07 DIAGNOSIS — H6121 Impacted cerumen, right ear: Secondary | ICD-10-CM | POA: Insufficient documentation

## 2017-11-04 ENCOUNTER — Other Ambulatory Visit: Payer: Self-pay | Admitting: Obstetrics and Gynecology

## 2017-11-04 DIAGNOSIS — Z1231 Encounter for screening mammogram for malignant neoplasm of breast: Secondary | ICD-10-CM

## 2017-11-05 DIAGNOSIS — N9411 Superficial (introital) dyspareunia: Secondary | ICD-10-CM | POA: Diagnosis not present

## 2017-11-05 DIAGNOSIS — Z130 Encounter for screening for diseases of the blood and blood-forming organs and certain disorders involving the immune mechanism: Secondary | ICD-10-CM | POA: Diagnosis not present

## 2017-11-05 DIAGNOSIS — B009 Herpesviral infection, unspecified: Secondary | ICD-10-CM | POA: Diagnosis not present

## 2017-11-05 DIAGNOSIS — Z6829 Body mass index (BMI) 29.0-29.9, adult: Secondary | ICD-10-CM | POA: Diagnosis not present

## 2017-11-05 DIAGNOSIS — Z1389 Encounter for screening for other disorder: Secondary | ICD-10-CM | POA: Diagnosis not present

## 2017-11-05 DIAGNOSIS — Z113 Encounter for screening for infections with a predominantly sexual mode of transmission: Secondary | ICD-10-CM | POA: Diagnosis not present

## 2017-11-05 DIAGNOSIS — Z01419 Encounter for gynecological examination (general) (routine) without abnormal findings: Secondary | ICD-10-CM | POA: Diagnosis not present

## 2017-11-05 DIAGNOSIS — N951 Menopausal and female climacteric states: Secondary | ICD-10-CM | POA: Diagnosis not present

## 2017-11-10 ENCOUNTER — Encounter: Payer: Self-pay | Admitting: Internal Medicine

## 2017-11-25 ENCOUNTER — Ambulatory Visit
Admission: RE | Admit: 2017-11-25 | Discharge: 2017-11-25 | Disposition: A | Discharge status: Home / Self Care | Payer: BLUE CROSS/BLUE SHIELD | Source: Ambulatory Visit | Attending: Obstetrics and Gynecology | Admitting: Obstetrics and Gynecology

## 2017-11-25 DIAGNOSIS — Z1231 Encounter for screening mammogram for malignant neoplasm of breast: Secondary | ICD-10-CM

## 2018-01-18 DIAGNOSIS — F411 Generalized anxiety disorder: Secondary | ICD-10-CM | POA: Diagnosis not present

## 2018-01-18 DIAGNOSIS — Z6828 Body mass index (BMI) 28.0-28.9, adult: Secondary | ICD-10-CM | POA: Diagnosis not present

## 2018-01-18 DIAGNOSIS — I10 Essential (primary) hypertension: Secondary | ICD-10-CM | POA: Diagnosis not present

## 2018-01-18 DIAGNOSIS — J302 Other seasonal allergic rhinitis: Secondary | ICD-10-CM | POA: Diagnosis not present

## 2018-02-08 ENCOUNTER — Encounter: Payer: Self-pay | Admitting: *Deleted

## 2018-02-08 ENCOUNTER — Other Ambulatory Visit: Payer: Self-pay | Admitting: *Deleted

## 2018-02-08 ENCOUNTER — Ambulatory Visit: Payer: BLUE CROSS/BLUE SHIELD | Admitting: Nurse Practitioner

## 2018-02-08 ENCOUNTER — Encounter: Payer: Self-pay | Admitting: Nurse Practitioner

## 2018-02-08 DIAGNOSIS — K5900 Constipation, unspecified: Secondary | ICD-10-CM

## 2018-02-08 DIAGNOSIS — R1030 Lower abdominal pain, unspecified: Secondary | ICD-10-CM

## 2018-02-08 DIAGNOSIS — R109 Unspecified abdominal pain: Secondary | ICD-10-CM | POA: Insufficient documentation

## 2018-02-08 DIAGNOSIS — R935 Abnormal findings on diagnostic imaging of other abdominal regions, including retroperitoneum: Secondary | ICD-10-CM | POA: Insufficient documentation

## 2018-02-08 MED ORDER — PEG 3350-KCL-NA BICARB-NACL 420 G PO SOLR: 4000.0000 mL | Freq: Once | ORAL | 0 refills | Status: AC

## 2018-02-08 NOTE — Patient Instructions (Signed)
1. Take Colace 100 mg once a day (available over-the-counter).   2. You can use MiraLAX powder, 1-2 doses a day as needed for constipation despite taking Colace daily. 3. We will schedule your colonoscopy for you. 4. Return for follow-up in 4 months. 5. Call us if you have any questions or concerns.  At [facility] we value your feedback. You may receive a survey about your visit today. Please share your experience as we strive to create trusting relationships with our patients to provide genuine, compassionate, quality care.  We appreciate your understanding and patience as we review any laboratory studies, imaging, and other diagnostic tests that are ordered as we care for you. Our office policy is 5 business days for review of these results, and any emergent or urgent results are addressed in a timely manner for your best interest. If you do not hear from our office in 1 week, please contact us.   We also encourage the use of MyChart, which contains your medical information for your review as well. If you are not enrolled in this feature, an access code is available on this after visit summary for your convenience. Thank you for allowing us to be involved in the care of you and your family.   It was great to meet you today!  I hope you have a great rest of the summer!!

## 2018-02-08 NOTE — Assessment & Plan Note (Signed)
The patient describes intermittent constipation.  It is not daily or severe.  At this point I doubt need for prescription management.  I will have her take Colace 100 mg once daily.  She can use MiraLAX 1-2 times a day as needed for breakthrough constipation.  Return for follow-up in 4 months.

## 2018-02-08 NOTE — Progress Notes (Signed)
CC'D TO PCP °

## 2018-02-08 NOTE — Assessment & Plan Note (Signed)
Abnormal CT of the abdomen which found moderate circumferential wall thickening of the descending colon consistent with colitis.  Her symptoms are likely due to her colitis at that time.  However, she is 14 and is never had a colonoscopy.  This time we will proceed with colonoscopy for colorectal screening and to follow-up on abnormal CT.  Return for follow-up in 4 months.  Proceed with TCS on propofol/MAC with Dr. Gala Romney in near future: the risks, benefits, and alternatives have been discussed with the patient in detail. The patient states understanding and desires to proceed.  The patient is currently on Xanax and has insomnia.  No other anticoagulants, anxiolytics, chronic pain medications, or antidepressants.  We will plan for the procedure on propofol/MAC to promote adequate sedation.

## 2018-02-08 NOTE — Progress Notes (Addendum)
REVIEWED. Uncomplicated colitis JAN 2019. NOW HAVING NL FORMED STOOL. TCS FOR SCREENING.  Primary Care Physician:  Toma Deiters, MD Primary Gastroenterologist:  Dr. Darrick Penna  Chief Complaint  Patient presents with  . Abdominal Pain    rlq; had colitis in Jan 2019  . Constipation    HPI:   [name] is a 52 y.o. female who presents on referral from the emergency department for diarrhea and abdominal pain.  ER note reviewed.  The patient was in the emergency department 07/03/2017 with a 10-hour history of diarrhea including 8 episodes, abdominal cramping which waxes and wanes with some improvement after bowel movement.  She also complained of bright red blood at the bottom of the toilet bowl.  She denied history of hemorrhoids.  Her rectal exam was strongly Hemoccult positive.  Labs at that time found white blood cell count of 11.7 otherwise essentially normal.  CT of the abdomen and pelvis found moderate circumferential wall thickening of the descending colon consistent with colitis, no colonic diverticula.  She was treated with Cipro and Flagyl and recommended GI evaluation.  Today she states she's doing well overall. Her previous symptoms resolved on antibiotics. Has never had a colonoscopy. Having constipation about once a day (occasionally) to every few days. Intermittent hard stools and straining. Denies hematochezia since ER. Has intermittent lower abdominal mild cramping with constipation, resolves after a bowel movement. Denies melena, fever, chills, unintentional weight loss. Denies chest pain, dyspnea, dizziness, lightheadedness, syncope, near syncope. Denies any other upper or lower GI symptoms.  Past Medical History:  Diagnosis Date  . Hypertension     Past Surgical History:  Procedure Laterality Date  . ABDOMINAL HYSTERECTOMY    . TONSILLECTOMY      Current Outpatient Medications  Medication Sig Dispense Refill  . ALPRAZolam (XANAX) 0.5 MG tablet Take 0.5 mg by  mouth at bedtime as needed.     . benazepril-hydrochlorthiazide (LOTENSIN HCT) 20-12.5 MG tablet Take 1 tablet by mouth daily.    . cetirizine (ZYRTEC) 10 MG tablet Take 10 mg by mouth as needed for allergies.    . clobetasol (TEMOVATE) 0.05 % external solution Apply 1 application topically 2 (two) times daily.    Marland Kitchen estradiol (VIVELLE-DOT) 0.075 MG/24HR Place 1 patch onto the skin 2 (two) times a week.    . Fluticasone Propionate (FLONASE ALLERGY RELIEF NA) Place into the nose as needed.     No current facility-administered medications for this visit.     Allergies as of 02/08/2018 - Review Complete 02/08/2018  Allergen Reaction Noted  . Morphine Itching   . Sulfa antibiotics Rash 07/03/2017    Family History  Problem Relation Age of Onset  . Colon cancer Neg Hx     Social History   Socioeconomic History  . Marital status: Married    Spouse name: Not on file  . Number of children: Not on file  . Years of education: Not on file  . Highest education level: Not on file  Occupational History  . Not on file  Social Needs  . Financial resource strain: Not on file  . Food insecurity:    Worry: Not on file    Inability: Not on file  . Transportation needs:    Medical: Not on file    Non-medical: Not on file  Tobacco Use  . Smoking status: Never Smoker  . Smokeless tobacco: Never Used  Substance and Sexual Activity  . Alcohol use: No    Frequency:  Never  . Drug use: No  . Sexual activity: Not on file  Lifestyle  . Physical activity:    Days per week: Not on file    Minutes per session: Not on file  . Stress: Not on file  Relationships  . Social connections:    Talks on phone: Not on file    Gets together: Not on file    Attends religious service: Not on file    Active member of club or organization: Not on file    Attends meetings of clubs or organizations: Not on file    Relationship status: Not on file  . Intimate partner violence:    Fear of current or ex  partner: Not on file    Emotionally abused: Not on file    Physically abused: Not on file    Forced sexual activity: Not on file  Other Topics Concern  . Not on file  Social History Narrative  . Not on file    Review of Systems: Complete ROS negative except as per HPI.    Physical Exam: BP 107/67   Pulse 60   Temp (!) 97 F (36.1 C) (Oral)   Ht 5' 3.5" (1.613 m)   Wt 167 lb 12.8 oz (76.1 kg)   BMI 29.26 kg/m  General:   Alert and oriented. Pleasant and cooperative. Well-nourished and well-developed.  Head:  Normocephalic and atraumatic. Eyes:  Without icterus, sclera clear and conjunctiva pink.  Ears:  Normal auditory acuity. Cardiovascular:  S1, S2 present without murmurs appreciated. Extremities without clubbing or edema. Respiratory:  Clear to auscultation bilaterally. No wheezes, rales, or rhonchi. No distress.  Gastrointestinal:  +BS, soft, non-tender and non-distended. No HSM noted. No guarding or rebound. No masses appreciated.  Rectal:  Deferred  Musculoskalatal:  Symmetrical without gross deformities Neurologic:  Alert and oriented x4;  grossly normal neurologically. Psych:  Alert and cooperative. Normal mood and affect. Heme/Lymph/Immune: No excessive bruising noted.    02/08/2018 10:30 AM   Disclaimer: This note was dictated with voice recognition software. Similar sounding words can inadvertently be transcribed and may not be corrected upon review.

## 2018-02-08 NOTE — Assessment & Plan Note (Signed)
Mild lower abdominal crampy pain associated with constipation.  Her pain improves with a bowel movement.  Her pain is likely due to her constipation.  We will further treat her constipation as per above.  I feel better controlling her mild constipation will likely improve her abdominal symptoms.  Follow-up in 4 months.

## 2018-04-05 ENCOUNTER — Telehealth: Payer: Self-pay | Admitting: *Deleted

## 2018-04-05 MED ORDER — CLENPIQ 10-3.5-12 MG-GM -GM/160ML PO SOLN: 1.0000 | Freq: Once | ORAL | 0 refills | Status: AC

## 2018-04-05 NOTE — Telephone Encounter (Signed)
Patient called in stating her insurance will cover clenpiq. She requested Rx to be sent to [facility]. She did contact her pharmacy and they do have this in stock. New instructions mailed to patient.

## 2018-04-19 ENCOUNTER — Encounter (HOSPITAL_COMMUNITY): Payer: Self-pay | Admitting: *Deleted

## 2018-04-19 ENCOUNTER — Encounter (HOSPITAL_COMMUNITY)
Admission: RE | Disposition: A | Discharge status: Home / Self Care | Payer: Self-pay | Source: Ambulatory Visit | Attending: Gastroenterology

## 2018-04-19 ENCOUNTER — Other Ambulatory Visit: Payer: Self-pay

## 2018-04-19 ENCOUNTER — Ambulatory Visit (HOSPITAL_COMMUNITY)
Admission: RE | Admit: 2018-04-19 | Discharge: 2018-04-19 | Disposition: A | Discharge status: Home / Self Care | Payer: BLUE CROSS/BLUE SHIELD | Source: Ambulatory Visit | Attending: Gastroenterology | Admitting: Gastroenterology

## 2018-04-19 DIAGNOSIS — Q438 Other specified congenital malformations of intestine: Secondary | ICD-10-CM | POA: Insufficient documentation

## 2018-04-19 DIAGNOSIS — K644 Residual hemorrhoidal skin tags: Secondary | ICD-10-CM | POA: Insufficient documentation

## 2018-04-19 DIAGNOSIS — R1030 Lower abdominal pain, unspecified: Secondary | ICD-10-CM

## 2018-04-19 DIAGNOSIS — Z882 Allergy status to sulfonamides status: Secondary | ICD-10-CM | POA: Insufficient documentation

## 2018-04-19 DIAGNOSIS — Z885 Allergy status to narcotic agent status: Secondary | ICD-10-CM | POA: Insufficient documentation

## 2018-04-19 DIAGNOSIS — I10 Essential (primary) hypertension: Secondary | ICD-10-CM | POA: Insufficient documentation

## 2018-04-19 DIAGNOSIS — Z1211 Encounter for screening for malignant neoplasm of colon: Secondary | ICD-10-CM

## 2018-04-19 DIAGNOSIS — R935 Abnormal findings on diagnostic imaging of other abdominal regions, including retroperitoneum: Secondary | ICD-10-CM

## 2018-04-19 HISTORY — PX: COLONOSCOPY: SHX5424

## 2018-04-19 SURGERY — COLONOSCOPY
Anesthesia: Moderate Sedation

## 2018-04-19 MED ORDER — MIDAZOLAM HCL 5 MG/5ML IJ SOLN
INTRAMUSCULAR | Status: AC
  Filled 2018-04-19: qty 10

## 2018-04-19 MED ORDER — PROMETHAZINE HCL 25 MG/ML IJ SOLN
12.5000 mg | Freq: Once | INTRAMUSCULAR | Status: AC
  Administered 2018-04-19: 12.5 mg via INTRAVENOUS

## 2018-04-19 MED ORDER — SODIUM CHLORIDE 0.9 % IV SOLN
INTRAVENOUS | Status: DC
  Administered 2018-04-19: 08:00:00 via INTRAVENOUS

## 2018-04-19 MED ORDER — SODIUM CHLORIDE 0.9% FLUSH
INTRAVENOUS | Status: AC
  Filled 2018-04-19: qty 10

## 2018-04-19 MED ORDER — LIDOCAINE VISCOUS HCL 2 % MT SOLN
OROMUCOSAL | Status: AC
  Filled 2018-04-19: qty 15

## 2018-04-19 MED ORDER — MIDAZOLAM HCL 5 MG/5ML IJ SOLN
INTRAMUSCULAR | Status: DC | PRN
  Administered 2018-04-19: 2 mg via INTRAVENOUS
  Administered 2018-04-19: 1 mg via INTRAVENOUS
  Administered 2018-04-19: 2 mg via INTRAVENOUS

## 2018-04-19 MED ORDER — MEPERIDINE HCL 100 MG/ML IJ SOLN
INTRAMUSCULAR | Status: AC
  Filled 2018-04-19: qty 2

## 2018-04-19 MED ORDER — STERILE WATER FOR IRRIGATION IR SOLN
Status: DC | PRN
  Administered 2018-04-19: 1.5 mL

## 2018-04-19 MED ORDER — MEPERIDINE HCL 100 MG/ML IJ SOLN
INTRAMUSCULAR | Status: DC | PRN
  Administered 2018-04-19: 50 mg via INTRAVENOUS
  Administered 2018-04-19: 25 mg via INTRAVENOUS

## 2018-04-19 MED ORDER — PROMETHAZINE HCL 25 MG/ML IJ SOLN
INTRAMUSCULAR | Status: AC
  Filled 2018-04-19: qty 1

## 2018-04-19 NOTE — Op Note (Signed)
[facility] Patient Name: [name] Procedure Date: 04/19/2018 8:19 AM MRN: [id number] Date of Birth: 08-Jan-1966 Attending MD: Jonette Eva MD, MD CSN: [id number] Age: 52 Admit Type: Outpatient Procedure:                Colonoscopy, SCREENING Indications:              Screening for colorectal malignant neoplasm Providers:                Jonette Eva MD, MD, Jannett Celestine, RN, Nena Polio, RN Referring MD:             Lia Hopping MD, MD Medicines:                Promethazine 12.5 mg IV, Meperidine 75 mg IV,                            Midazolam 5 mg IV Complications:            No immediate complications. Estimated Blood Loss:     Estimated blood loss: none. Procedure:                Pre-Anesthesia Assessment:                           - Prior to the procedure, a History and Physical                            was performed, and patient medications and                            allergies were reviewed. The patient's tolerance of                            previous anesthesia was also reviewed. The risks                            and benefits of the procedure and the sedation                            options and risks were discussed with the patient.                            All questions were answered, and informed consent                            was obtained. Prior Anticoagulants: The patient has                            taken no previous anticoagulant or antiplatelet                            agents. ASA Grade Assessment: II - A patient with                            mild systemic disease. After reviewing the risks  and benefits, the patient was deemed in                            satisfactory condition to undergo the procedure.                            After obtaining informed consent, the colonoscope                            was passed under direct vision. Throughout the                            procedure, the patient's blood  pressure, pulse, and                            oxygen saturations were monitored continuously. The                            [id number] ([id number]) scope was introduced through                            the anus and advanced to the 8 cm into the ileum.                            The colonoscopy was somewhat difficult due to a                            tortuous colon. Successful completion of the                            procedure was aided by increasing the dose of                            sedation medication, straightening and shortening                            the scope to obtain bowel loop reduction and                            COLOWRAP. The patient tolerated the procedure                            fairly well. The quality of the bowel preparation                            was excellent. The terminal ileum, ileocecal valve,                            appendiceal orifice, and rectum were photographed. Scope In: 8:53:32 AM Scope Out: 9:06:05 AM Scope Withdrawal Time: 0 hours 9 minutes 56 seconds  Total Procedure Duration: 0 hours 12 minutes 33 seconds  Findings:      The terminal ileum appeared normal.      The recto-sigmoid colon and sigmoid  colon were mildly tortuous.      External hemorrhoids were found. The hemorrhoids were small. Impression:               - The examined portion of the ileum was normal.                           - Tortuous colon.                           - External hemorrhoids. Moderate Sedation:      Moderate (conscious) sedation was administered by the endoscopy nurse       and supervised by the endoscopist. The following parameters were       monitored: oxygen saturation, heart rate, blood pressure, and response       to care. Total physician intraservice time was 24 minutes. Recommendation:           - Patient has a contact number available for                            emergencies. The signs and symptoms of potential                             delayed complications were discussed with the                            patient. Return to normal activities tomorrow.                            Written discharge instructions were provided to the                            patient.                           - High fiber diet.                           - Continue present medications.                           - Repeat colonoscopy in 10 years for surveillance. Procedure Code(s):        --- Professional ---                           [phone], Colonoscopy, flexible; diagnostic, including                            collection of specimen(s) by brushing or washing,                            when performed (separate procedure)                           99153, Moderate sedation; each additional 15  minutes intraservice time                           G0500, Moderate sedation services provided by the                            same physician or other qualified health care                            professional performing a gastrointestinal                            endoscopic service that sedation supports,                            requiring the presence of an independent trained                            observer to assist in the monitoring of the                            patient's level of consciousness and physiological                            status; initial 15 minutes of intra-service time;                            patient age 45 years or older (additional time may                            be reported with 69629, as appropriate) Diagnosis Code(s):        --- Professional ---                           Z12.11, Encounter for screening for malignant                            neoplasm of colon                           K64.4, Residual hemorrhoidal skin tags                           Q43.8, Other specified congenital malformations of                            intestine CPT copyright 2018 American Medical  Association. All rights reserved. The codes documented in this report are preliminary and upon coder review may  be revised to meet current compliance requirements. Jonette Eva, MD Jonette Eva MD, MD 04/19/2018 9:21:46 AM This report has been signed electronically. Number of Addenda: 0

## 2018-04-19 NOTE — H&P (Addendum)
Primary Care Physician:  Toma Deiters, MD Primary Gastroenterologist:  Dr. Darrick Penna  Pre-Procedure History & Physical: HPI:  [name] is a 52 y.o. female here for COLON CANCER SCREENING.  Past Medical History:  Diagnosis Date  . Hypertension     Past Surgical History:  Procedure Laterality Date  . ABDOMINAL HYSTERECTOMY    . TONSILLECTOMY      Prior to Admission medications   Medication Sig Start Date End Date Taking? Authorizing Provider  ALPRAZolam Prudy Feeler) 0.5 MG tablet Take 0.5 mg by mouth at bedtime as needed.    Yes [provider]  benazepril-hydrochlorthiazide (LOTENSIN HCT) 20-12.5 MG tablet Take 1 tablet by mouth daily.   Yes [provider]  cetirizine (ZYRTEC) 10 MG tablet Take 10 mg by mouth as needed for allergies.   Yes [provider]  clobetasol (TEMOVATE) 0.05 % external solution Apply 1 application topically 2 (two) times daily.   Yes [provider]  estradiol (VIVELLE-DOT) 0.075 MG/24HR Place 1 patch onto the skin 2 (two) times a week. 01/27/18  Yes [provider]  Fluticasone Propionate (FLONASE ALLERGY RELIEF NA) Place into the nose as needed.   Yes [provider]    Allergies as of 02/08/2018 - Review Complete 02/08/2018  Allergen Reaction Noted  . Morphine Itching   . Sulfa antibiotics Rash 07/03/2017    Family History  Problem Relation Age of Onset  . Colon cancer Neg Hx     Social History   Socioeconomic History  . Marital status: Married    Spouse name: Not on file  . Number of children: Not on file  . Years of education: Not on file  . Highest education level: Not on file  Occupational History  . Not on file  Social Needs  . Financial resource strain: Not on file  . Food insecurity:    Worry: Not on file    Inability: Not on file  . Transportation needs:    Medical: Not on file    Non-medical: Not on file  Tobacco Use  . Smoking status: Never Smoker  . Smokeless  tobacco: Never Used  Substance and Sexual Activity  . Alcohol use: No    Frequency: Never  . Drug use: No  . Sexual activity: Not on file  Lifestyle  . Physical activity:    Days per week: Not on file    Minutes per session: Not on file  . Stress: Not on file  Relationships  . Social connections:    Talks on phone: Not on file    Gets together: Not on file    Attends religious service: Not on file    Active member of club or organization: Not on file    Attends meetings of clubs or organizations: Not on file    Relationship status: Not on file  . Intimate partner violence:    Fear of current or ex partner: Not on file    Emotionally abused: Not on file    Physically abused: Not on file    Forced sexual activity: Not on file  Other Topics Concern  . Not on file  Social History Narrative  . Not on file    Review of Systems: See HPI, otherwise negative ROS   Physical Exam: BP 109/73   Pulse 76   Temp 98.2 F (36.8 C) (Oral)   Resp 17   Ht 5' 3.5" (1.613 m)   Wt 76.2 kg   SpO2 100%  BMI 29.29 kg/m  General:   Alert,  pleasant and cooperative in NAD Head:  Normocephalic and atraumatic. Neck:  Supple; Lungs:  Clear throughout to auscultation.    Heart:  Regular rate and rhythm. Abdomen:  Soft, nontender and nondistended. Normal bowel sounds, without guarding, and without rebound.   Neurologic:  Alert and  oriented x4;  grossly normal neurologically.  Impression/Plan:    SCREENING  Plan:  1. TCS TODAY DISCUSSED PROCEDURE, BENEFITS, & RISKS: < 1% chance of medication reaction, bleeding, perforation, or rupture of spleen/liver.

## 2018-04-19 NOTE — Discharge Instructions (Signed)
You have small extenal hemorrhoids. YOU DID NOT HAVE ANY POLYPS. THE LAST PART OF YOUr SMALL BOWEL IS NORMAL.    DRINK WATER TO KEEP YOUR URINE LIGHT YELLOW.  FOLLOW A HIGH FIBER DIET. AVOID ITEMS THAT CAUSE BLOATING. SEE INFO BELOW.  USE PREPARATION H FOUR TIMES  A DAY IF NEEDED TO RELIEVE RECTAL PAIN/PRESSURE/BLEEDING.  Next colonoscopy in 10 years.  Colonoscopy Care After Read the instructions outlined below and refer to this sheet in the next week. These discharge instructions provide you with general information on caring for yourself after you leave the hospital. While your treatment has been planned according to the most current medical practices available, unavoidable complications occasionally occur. If you have any problems or questions after discharge, call DR. Genevieve Arbaugh, [phone].  ACTIVITY  You may resume your regular activity, but move at a slower pace for the next 24 hours.   Take frequent rest periods for the next 24 hours.   Walking will help get rid of the air and reduce the bloated feeling in your belly (abdomen).   No driving for 24 hours (because of the medicine (anesthesia) used during the test).   You may shower.   Do not sign any important legal documents or operate any machinery for 24 hours (because of the anesthesia used during the test).    NUTRITION  Drink plenty of fluids.   You may resume your normal diet as instructed by your doctor.   Begin with a light meal and progress to your normal diet. Heavy or fried foods are harder to digest and may make you feel sick to your stomach (nauseated).   Avoid alcoholic beverages for 24 hours or as instructed.    MEDICATIONS  You may resume your normal medications.   WHAT YOU CAN EXPECT TODAY  Some feelings of bloating in the abdomen.   Passage of more gas than usual.   Spotting of blood in your stool or on the toilet paper  .  IF YOU HAD POLYPS REMOVED DURING THE COLONOSCOPY:  Eat a soft  diet IF YOU HAVE NAUSEA, BLOATING, ABDOMINAL PAIN, OR VOMITING.    FINDING OUT THE RESULTS OF YOUR TEST Not all test results are available during your visit. DR. Darrick Penna WILL CALL YOU WITHIN 14 DAYS OF YOUR PROCEDUE WITH YOUR RESULTS. Do not assume everything is normal if you have not heard from DR. Rohil Lesch, CALL HER OFFICE AT [phone].  SEEK IMMEDIATE MEDICAL ATTENTION AND CALL THE OFFICE: [phone] IF:  You have more than a spotting of blood in your stool.   Your belly is swollen (abdominal distention).   You are nauseated or vomiting.   You have a temperature over 101F.   You have abdominal pain or discomfort that is severe or gets worse throughout the day.  High-Fiber Diet A high-fiber diet changes your normal diet to include more whole grains, legumes, fruits, and vegetables. Changes in the diet involve replacing refined carbohydrates with unrefined foods. The calorie level of the diet is essentially unchanged. The Dietary Reference Intake (recommended amount) for adult males is 38 grams per day. For adult females, it is 25 grams per day. Pregnant and lactating women should consume 28 grams of fiber per day. Fiber is the intact part of a plant that is not broken down during digestion. Functional fiber is fiber that has been isolated from the plant to provide a beneficial effect in the body. PURPOSE  Increase stool bulk.   Ease and regulate bowel movements.  Lower cholesterol.   REDUCE RISK OF COLON CANCER  INDICATIONS THAT YOU NEED MORE FIBER  Constipation and hemorrhoids.   Uncomplicated diverticulosis (intestine condition) and irritable bowel syndrome.   Weight management.   As a protective measure against hardening of the arteries (atherosclerosis), diabetes, and cancer.   GUIDELINES FOR INCREASING FIBER IN THE DIET  Start adding fiber to the diet slowly. A gradual increase of about 5 more grams (2 slices of whole-wheat bread, 2 servings of most fruits or  vegetables, or 1 bowl of high-fiber cereal) per day is best. Too rapid an increase in fiber may result in constipation, flatulence, and bloating.   Drink enough water and fluids to keep your urine clear or pale yellow. Water, juice, or caffeine-free drinks are recommended. Not drinking enough fluid may cause constipation.   Eat a variety of high-fiber foods rather than one type of fiber.   Try to increase your intake of fiber through using high-fiber foods rather than fiber pills or supplements that contain small amounts of fiber.   The goal is to change the types of food eaten. Do not supplement your present diet with high-fiber foods, but replace foods in your present diet.    INCLUDE A VARIETY OF FIBER SOURCES  Replace refined and processed grains with whole grains, canned fruits with fresh fruits, and incorporate other fiber sources. White rice, white breads, and most bakery goods contain little or no fiber.   Brown whole-grain rice, buckwheat oats, and many fruits and vegetables are all good sources of fiber. These include: broccoli, Brussels sprouts, cabbage, cauliflower, beets, sweet potatoes, white potatoes (skin on), carrots, tomatoes, eggplant, squash, berries, fresh fruits, and dried fruits.   Cereals appear to be the richest source of fiber. Cereal fiber is found in whole grains and bran. Bran is the fiber-rich outer coat of cereal grain, which is largely removed in refining. In whole-grain cereals, the bran remains. In breakfast cereals, the largest amount of fiber is found in those with "bran" in their names. The fiber content is sometimes indicated on the label.   You may need to include additional fruits and vegetables each day.   In baking, for 1 cup white flour, you may use the following substitutions:   1 cup whole-wheat flour minus 2 tablespoons.   1/2 cup white flour plus 1/2 cup whole-wheat flour.   Hemorrhoids Hemorrhoids are dilated (enlarged) veins around the  rectum. Sometimes clots will form in the veins. This makes them swollen and painful. These are called thrombosed hemorrhoids. Causes of hemorrhoids include:  Constipation.   Straining to have a bowel movement.   HEAVY LIFTING  HOME CARE INSTRUCTIONS  Eat a well balanced diet and drink 6 to 8 glasses of water every day to avoid constipation. You may also use a bulk laxative.   Avoid straining to have bowel movements.   Keep anal area dry and clean.   Do not use a donut shaped pillow or sit on the toilet for long periods. This increases blood pooling and pain.   Move your bowels when your body has the urge; this will require less straining and will decrease pain and pressure.

## 2018-04-21 ENCOUNTER — Encounter (HOSPITAL_COMMUNITY): Payer: Self-pay | Admitting: Gastroenterology

## 2018-05-21 DIAGNOSIS — Z0000 Encounter for general adult medical examination without abnormal findings: Secondary | ICD-10-CM | POA: Diagnosis not present

## 2018-05-21 DIAGNOSIS — Z6829 Body mass index (BMI) 29.0-29.9, adult: Secondary | ICD-10-CM | POA: Diagnosis not present

## 2018-07-19 DIAGNOSIS — L820 Inflamed seborrheic keratosis: Secondary | ICD-10-CM | POA: Diagnosis not present

## 2018-07-19 DIAGNOSIS — L218 Other seborrheic dermatitis: Secondary | ICD-10-CM | POA: Diagnosis not present

## 2018-08-19 DIAGNOSIS — Z6829 Body mass index (BMI) 29.0-29.9, adult: Secondary | ICD-10-CM | POA: Diagnosis not present

## 2018-08-19 DIAGNOSIS — I10 Essential (primary) hypertension: Secondary | ICD-10-CM | POA: Diagnosis not present

## 2018-08-19 DIAGNOSIS — H81313 Aural vertigo, bilateral: Secondary | ICD-10-CM | POA: Diagnosis not present

## 2018-09-04 DIAGNOSIS — L255 Unspecified contact dermatitis due to plants, except food: Secondary | ICD-10-CM | POA: Diagnosis not present

## 2018-11-09 ENCOUNTER — Other Ambulatory Visit: Payer: Self-pay | Admitting: Obstetrics and Gynecology

## 2018-11-09 DIAGNOSIS — Z1231 Encounter for screening mammogram for malignant neoplasm of breast: Secondary | ICD-10-CM

## 2018-11-15 DIAGNOSIS — M79641 Pain in right hand: Secondary | ICD-10-CM | POA: Diagnosis not present

## 2018-11-18 DIAGNOSIS — I10 Essential (primary) hypertension: Secondary | ICD-10-CM | POA: Diagnosis not present

## 2018-11-18 DIAGNOSIS — Z6829 Body mass index (BMI) 29.0-29.9, adult: Secondary | ICD-10-CM | POA: Diagnosis not present

## 2018-11-18 DIAGNOSIS — F419 Anxiety disorder, unspecified: Secondary | ICD-10-CM | POA: Diagnosis not present

## 2018-11-18 DIAGNOSIS — J309 Allergic rhinitis, unspecified: Secondary | ICD-10-CM | POA: Diagnosis not present

## 2018-12-27 ENCOUNTER — Ambulatory Visit: Payer: BLUE CROSS/BLUE SHIELD

## 2019-01-28 ENCOUNTER — Other Ambulatory Visit: Payer: Self-pay

## 2019-01-28 ENCOUNTER — Ambulatory Visit
Admission: RE | Admit: 2019-01-28 | Discharge: 2019-01-28 | Disposition: A | Discharge status: Home / Self Care | Payer: BC Managed Care – PPO | Source: Ambulatory Visit | Attending: Obstetrics and Gynecology | Admitting: Obstetrics and Gynecology

## 2019-01-28 DIAGNOSIS — Z1231 Encounter for screening mammogram for malignant neoplasm of breast: Secondary | ICD-10-CM | POA: Diagnosis not present

## 2019-01-31 DIAGNOSIS — L638 Other alopecia areata: Secondary | ICD-10-CM | POA: Diagnosis not present

## 2019-01-31 DIAGNOSIS — L708 Other acne: Secondary | ICD-10-CM | POA: Diagnosis not present

## 2019-03-28 DIAGNOSIS — J3089 Other allergic rhinitis: Secondary | ICD-10-CM | POA: Diagnosis not present

## 2019-03-28 DIAGNOSIS — H6123 Impacted cerumen, bilateral: Secondary | ICD-10-CM | POA: Diagnosis not present

## 2019-03-28 DIAGNOSIS — I10 Essential (primary) hypertension: Secondary | ICD-10-CM | POA: Diagnosis not present

## 2019-03-28 DIAGNOSIS — F411 Generalized anxiety disorder: Secondary | ICD-10-CM | POA: Diagnosis not present

## 2019-04-25 DIAGNOSIS — Z6829 Body mass index (BMI) 29.0-29.9, adult: Secondary | ICD-10-CM | POA: Diagnosis not present

## 2019-04-25 DIAGNOSIS — R5383 Other fatigue: Secondary | ICD-10-CM | POA: Diagnosis not present

## 2019-05-16 DIAGNOSIS — Z6829 Body mass index (BMI) 29.0-29.9, adult: Secondary | ICD-10-CM | POA: Diagnosis not present

## 2019-05-16 DIAGNOSIS — M5489 Other dorsalgia: Secondary | ICD-10-CM | POA: Diagnosis not present

## 2019-05-16 DIAGNOSIS — F3341 Major depressive disorder, recurrent, in partial remission: Secondary | ICD-10-CM | POA: Diagnosis not present

## 2019-05-16 DIAGNOSIS — F411 Generalized anxiety disorder: Secondary | ICD-10-CM | POA: Diagnosis not present

## 2019-05-30 DIAGNOSIS — F411 Generalized anxiety disorder: Secondary | ICD-10-CM | POA: Diagnosis not present

## 2019-05-30 DIAGNOSIS — M5489 Other dorsalgia: Secondary | ICD-10-CM | POA: Diagnosis not present

## 2019-05-30 DIAGNOSIS — F3341 Major depressive disorder, recurrent, in partial remission: Secondary | ICD-10-CM | POA: Diagnosis not present

## 2019-05-30 DIAGNOSIS — Z6830 Body mass index (BMI) 30.0-30.9, adult: Secondary | ICD-10-CM | POA: Diagnosis not present

## 2019-12-20 ENCOUNTER — Other Ambulatory Visit: Payer: Self-pay | Admitting: Obstetrics and Gynecology

## 2019-12-20 DIAGNOSIS — Z1231 Encounter for screening mammogram for malignant neoplasm of breast: Secondary | ICD-10-CM

## 2020-01-30 ENCOUNTER — Other Ambulatory Visit: Payer: Self-pay

## 2020-01-30 ENCOUNTER — Ambulatory Visit
Admission: RE | Admit: 2020-01-30 | Discharge: 2020-01-30 | Disposition: A | Discharge status: Home / Self Care | Payer: BC Managed Care – PPO | Source: Ambulatory Visit | Attending: Obstetrics and Gynecology | Admitting: Obstetrics and Gynecology

## 2020-01-30 DIAGNOSIS — Z1231 Encounter for screening mammogram for malignant neoplasm of breast: Secondary | ICD-10-CM

## 2020-08-14 DIAGNOSIS — H9311 Tinnitus, right ear: Secondary | ICD-10-CM | POA: Insufficient documentation

## 2020-08-14 DIAGNOSIS — J3489 Other specified disorders of nose and nasal sinuses: Secondary | ICD-10-CM | POA: Insufficient documentation

## 2020-08-14 DIAGNOSIS — H903 Sensorineural hearing loss, bilateral: Secondary | ICD-10-CM | POA: Insufficient documentation

## 2020-09-27 ENCOUNTER — Other Ambulatory Visit: Payer: Self-pay | Admitting: Podiatry

## 2020-09-27 ENCOUNTER — Encounter: Payer: Self-pay | Admitting: Podiatry

## 2020-09-27 ENCOUNTER — Other Ambulatory Visit: Payer: Self-pay

## 2020-09-27 ENCOUNTER — Ambulatory Visit (INDEPENDENT_AMBULATORY_CARE_PROVIDER_SITE_OTHER): Payer: 59

## 2020-09-27 ENCOUNTER — Ambulatory Visit (INDEPENDENT_AMBULATORY_CARE_PROVIDER_SITE_OTHER): Payer: 59 | Admitting: Podiatry

## 2020-09-27 DIAGNOSIS — M722 Plantar fascial fibromatosis: Secondary | ICD-10-CM

## 2020-09-27 DIAGNOSIS — S86311A Strain of muscle(s) and tendon(s) of peroneal muscle group at lower leg level, right leg, initial encounter: Secondary | ICD-10-CM

## 2020-09-27 DIAGNOSIS — M7671 Peroneal tendinitis, right leg: Secondary | ICD-10-CM

## 2020-09-27 DIAGNOSIS — M79671 Pain in right foot: Secondary | ICD-10-CM

## 2020-09-27 DIAGNOSIS — M79672 Pain in left foot: Secondary | ICD-10-CM

## 2020-09-27 DIAGNOSIS — G4700 Insomnia, unspecified: Secondary | ICD-10-CM | POA: Insufficient documentation

## 2020-09-27 DIAGNOSIS — N959 Unspecified menopausal and perimenopausal disorder: Secondary | ICD-10-CM | POA: Insufficient documentation

## 2020-09-27 DIAGNOSIS — F419 Anxiety disorder, unspecified: Secondary | ICD-10-CM | POA: Insufficient documentation

## 2020-09-27 DIAGNOSIS — N951 Menopausal and female climacteric states: Secondary | ICD-10-CM | POA: Insufficient documentation

## 2020-09-27 DIAGNOSIS — N952 Postmenopausal atrophic vaginitis: Secondary | ICD-10-CM | POA: Insufficient documentation

## 2020-09-27 MED ORDER — DICLOFENAC SODIUM 75 MG PO TBEC: 75.0000 mg | DELAYED_RELEASE_TABLET | Freq: Two times a day (BID) | ORAL | 2 refills | Status: DC

## 2020-09-27 NOTE — Patient Instructions (Signed)

## 2020-09-30 DIAGNOSIS — M722 Plantar fascial fibromatosis: Secondary | ICD-10-CM | POA: Diagnosis not present

## 2020-09-30 MED ORDER — TRIAMCINOLONE ACETONIDE 10 MG/ML IJ SUSP
10.0000 mg | Freq: Once | INTRAMUSCULAR | Status: AC
  Administered 2020-09-30: 10 mg

## 2020-09-30 NOTE — Progress Notes (Signed)
Subjective:   Patient ID: [name], female   DOB: 54 y.o.   MRN: [id number]   HPI Patient presents with a lot of pain in the right heel several month duration and starting to get pain in the outside of the right foot.  States she feels like she is walking differently and states it is hard for her to bear proper plantar weight on her heel.  Patient does not smoke and likes to be active   Review of Systems  All other systems reviewed and are negative.       Objective:  Physical Exam Vitals and nursing note reviewed.  Constitutional:      Appearance: She is well-developed.  Pulmonary:     Effort: Pulmonary effort is normal.  Musculoskeletal:        General: Normal range of motion.  Skin:    General: Skin is warm.  Neurological:     Mental Status: She is alert.     Neurovascular status found to be intact muscle strength found to be adequate range of motion adequate.  Patient is found to have exquisite discomfort lateral side right foot at the insertional point of the peroneal tendon into the base of the fifth metatarsal with fluid buildup at the insertional point.  Patient is found to have good digital perfusion well oriented x3 and has exquisite discomfort in the plantar fascial insertion to the calcaneus right     Assessment:  Acute plantar fasciitis right with probability for compensation creating lateral foot pain     Plan:  H&P reviewed condition discussed with patient.  At this point I did sterile prep injected the plantar fascia right 3 mg Kenalog 5 mg Xylocaine applied fascial brace gave instructions for physical therapy placed on diclofenac 75 mg twice daily and reappoint to recheck  X-rays indicate small spur and did not indicate any stress fracture or irritation of the outside of the right foot

## 2020-10-11 ENCOUNTER — Encounter: Payer: Self-pay | Admitting: Podiatry

## 2020-10-11 ENCOUNTER — Ambulatory Visit (INDEPENDENT_AMBULATORY_CARE_PROVIDER_SITE_OTHER): Payer: 59 | Admitting: Podiatry

## 2020-10-11 ENCOUNTER — Other Ambulatory Visit: Payer: Self-pay

## 2020-10-11 DIAGNOSIS — M722 Plantar fascial fibromatosis: Secondary | ICD-10-CM

## 2020-10-11 DIAGNOSIS — M21619 Bunion of unspecified foot: Secondary | ICD-10-CM | POA: Diagnosis not present

## 2020-10-11 DIAGNOSIS — M2042 Other hammer toe(s) (acquired), left foot: Secondary | ICD-10-CM | POA: Diagnosis not present

## 2020-10-11 NOTE — Progress Notes (Signed)
Subjective:   Patient ID: [name], female   DOB: 55 y.o.   MRN: [id number]   HPI Patient presents stating she does seem to be improving with diminishment of discomfort but is slightly concerned about her structural abnormality with structural deformity and digital elevation   ROS      Objective:  Physical Exam  Neurovascular status intact with patient found to have moderate digital deformity of the second digit left foot with mild structural bunion and heel pain improved but present     Assessment:  Fasciitis like symptoms improving but still mild discomfort along with moderate digital deformity hammertoe deformity and bunion deformity     Plan:  H&P reviewed all conditions recommended the continuation of supportive shoes anti-inflammatories and wide toe box.  Patient will be seen back if any of the symptoms were to get worse and someday may require surgical intervention which I educated her on today

## 2020-12-18 ENCOUNTER — Other Ambulatory Visit: Payer: Self-pay | Admitting: Podiatry

## 2020-12-18 NOTE — Telephone Encounter (Signed)
Please advise 

## 2021-01-02 ENCOUNTER — Other Ambulatory Visit: Payer: Self-pay | Admitting: Obstetrics and Gynecology

## 2021-01-02 DIAGNOSIS — Z1231 Encounter for screening mammogram for malignant neoplasm of breast: Secondary | ICD-10-CM

## 2021-01-13 IMAGING — MG DIGITAL SCREENING BILAT W/ TOMO W/ CAD
8 series · 8 of 24 positions shown · non-contrast
Comparison: Previous exam(s).

CLINICAL DATA: Screening.

EXAM:
DIGITAL SCREENING BILATERAL MAMMOGRAM WITH TOMO AND CAD

[R MLO synth-2D]
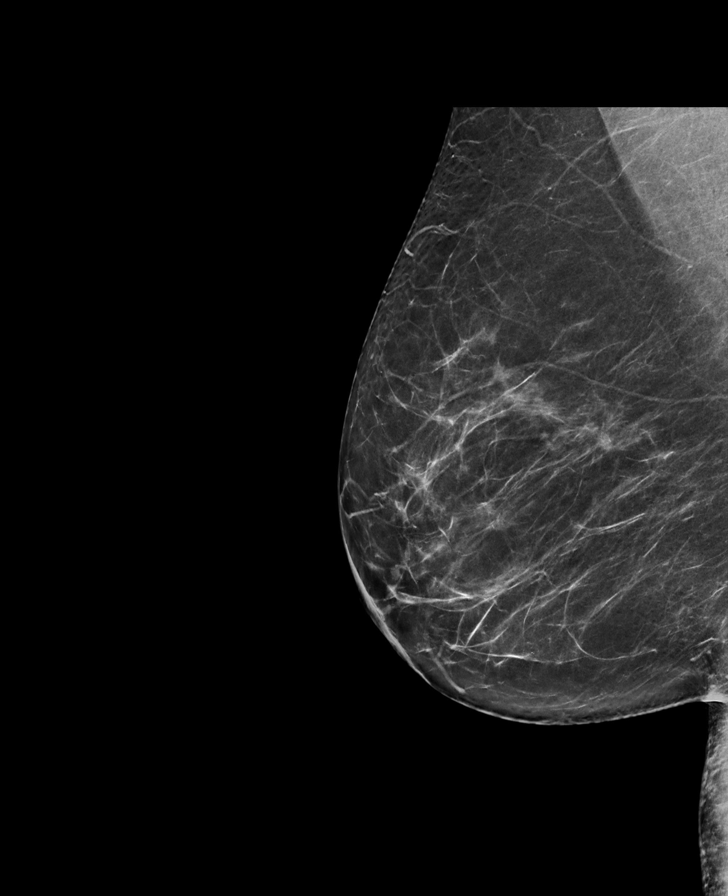

[L MLO synth-2D]
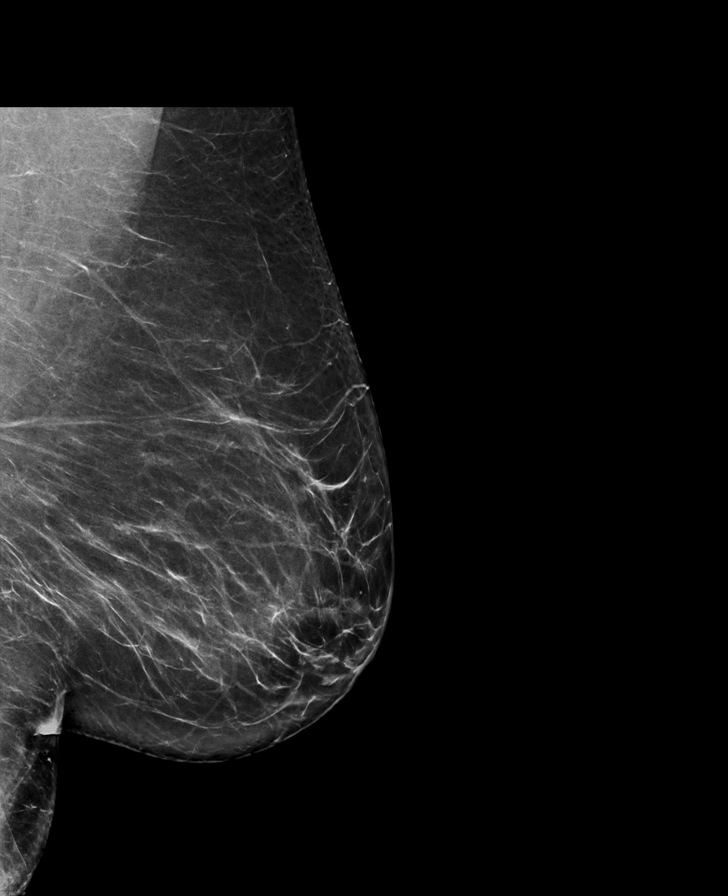

[L CC synth-2D]
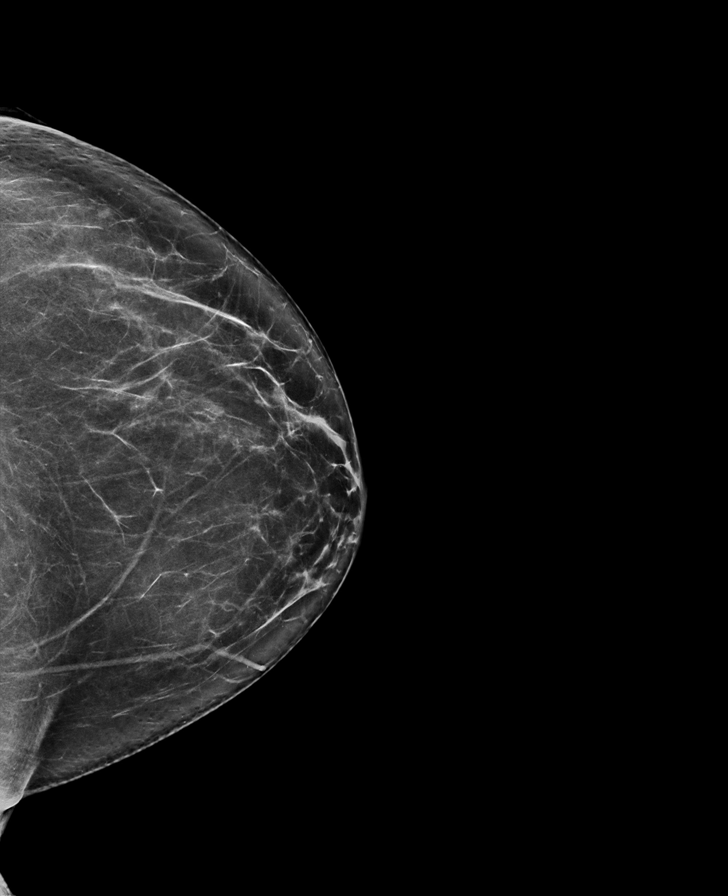

[R CC synth-2D]
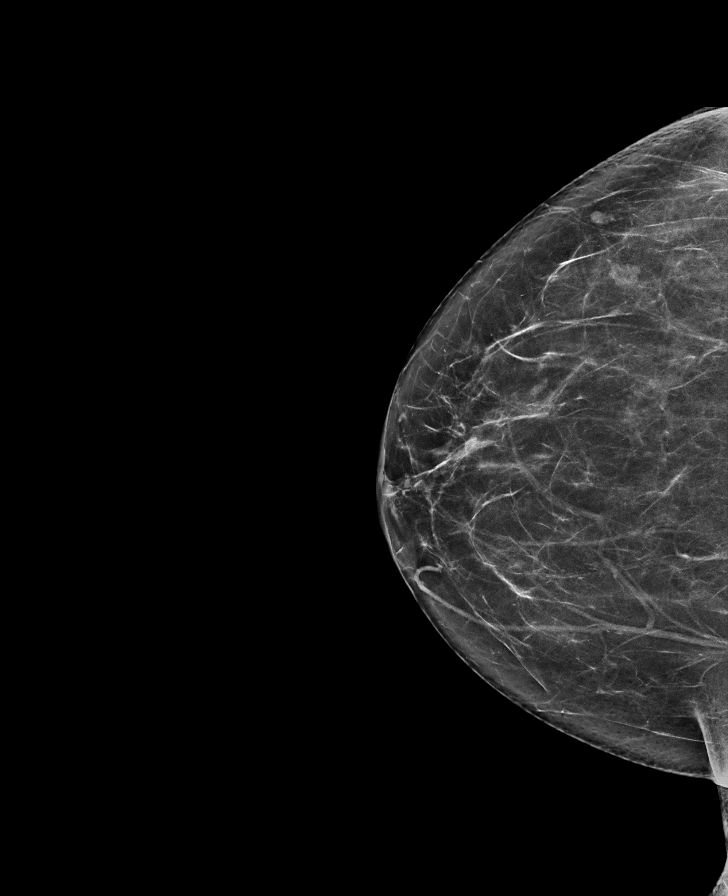

[L MLO tomo · tomo slice 41/81.0]
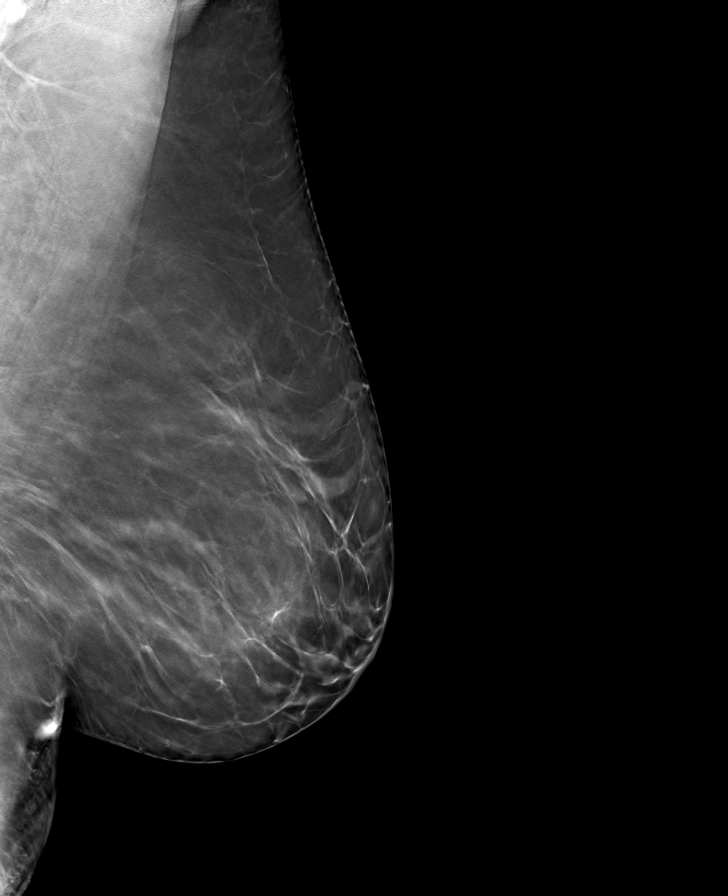

[R CC tomo · tomo slice 37/73.0]
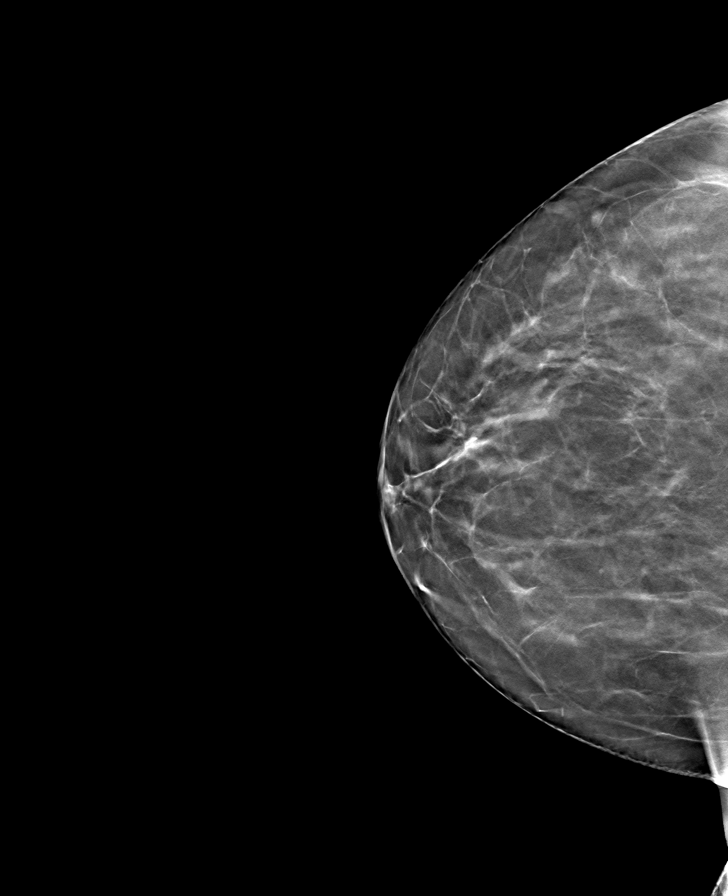

[L CC tomo · tomo slice 39/77.0]
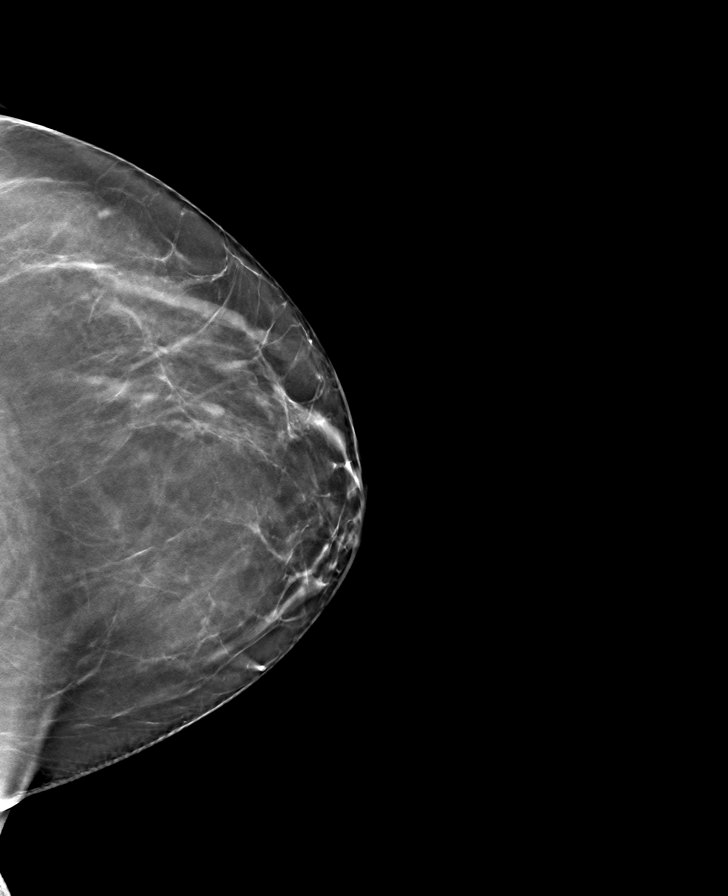

[R MLO tomo · tomo slice 39/76.0]
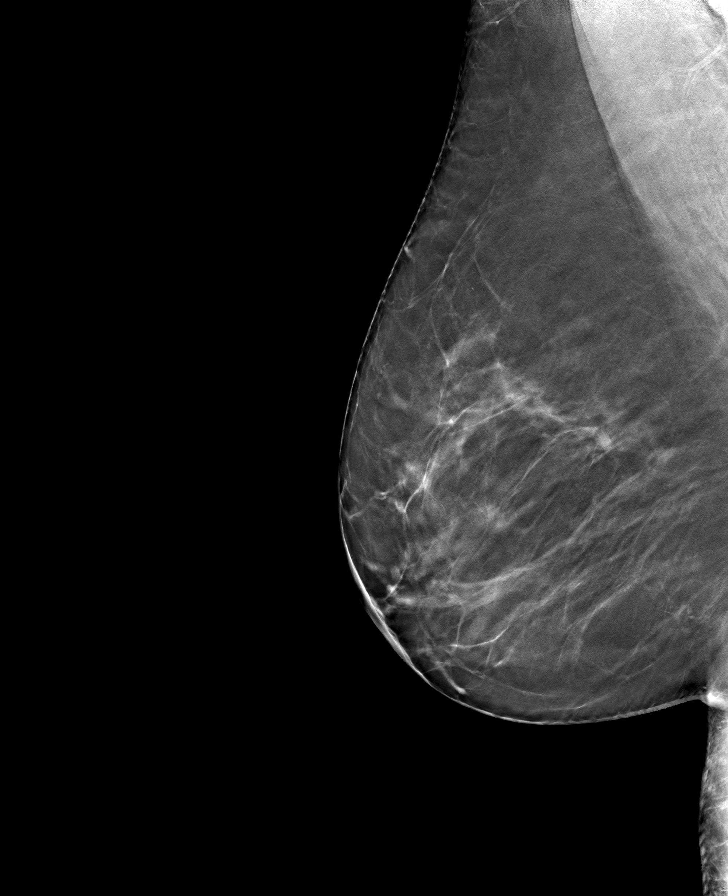

[8 of 24 positions shown; findings below may reference images not displayed]

ACR Breast Density Category b: There are scattered areas of
fibroglandular density.
FINDINGS: There are no findings suspicious for malignancy. Images were
processed with CAD.
IMPRESSION: No mammographic evidence of malignancy. A result letter of this
screening mammogram will be mailed directly to the patient.

RECOMMENDATION:
Screening mammogram in one year. (Code:[id number])

BI-RADS CATEGORY  1: Negative.

## 2021-03-14 ENCOUNTER — Other Ambulatory Visit: Payer: Self-pay | Admitting: Podiatry

## 2021-05-29 ENCOUNTER — Other Ambulatory Visit: Payer: Self-pay | Admitting: Podiatry

## 2021-10-03 DIAGNOSIS — I10 Essential (primary) hypertension: Secondary | ICD-10-CM | POA: Diagnosis not present

## 2022-04-21 DIAGNOSIS — I10 Essential (primary) hypertension: Secondary | ICD-10-CM | POA: Diagnosis not present

## 2022-04-21 DIAGNOSIS — F411 Generalized anxiety disorder: Secondary | ICD-10-CM | POA: Diagnosis not present

## 2022-04-21 DIAGNOSIS — Z6828 Body mass index (BMI) 28.0-28.9, adult: Secondary | ICD-10-CM | POA: Diagnosis not present

## 2022-04-21 DIAGNOSIS — J302 Other seasonal allergic rhinitis: Secondary | ICD-10-CM | POA: Diagnosis not present

## 2022-05-03 DIAGNOSIS — H1013 Acute atopic conjunctivitis, bilateral: Secondary | ICD-10-CM | POA: Diagnosis not present

## 2022-05-03 DIAGNOSIS — H40033 Anatomical narrow angle, bilateral: Secondary | ICD-10-CM | POA: Diagnosis not present

## 2022-06-02 DIAGNOSIS — Z130 Encounter for screening for diseases of the blood and blood-forming organs and certain disorders involving the immune mechanism: Secondary | ICD-10-CM | POA: Diagnosis not present

## 2022-06-02 DIAGNOSIS — N898 Other specified noninflammatory disorders of vagina: Secondary | ICD-10-CM | POA: Diagnosis not present

## 2022-06-02 DIAGNOSIS — Z01411 Encounter for gynecological examination (general) (routine) with abnormal findings: Secondary | ICD-10-CM | POA: Diagnosis not present

## 2022-06-02 DIAGNOSIS — Z113 Encounter for screening for infections with a predominantly sexual mode of transmission: Secondary | ICD-10-CM | POA: Diagnosis not present

## 2022-06-02 DIAGNOSIS — Z1389 Encounter for screening for other disorder: Secondary | ICD-10-CM | POA: Diagnosis not present

## 2022-06-02 DIAGNOSIS — Z1231 Encounter for screening mammogram for malignant neoplasm of breast: Secondary | ICD-10-CM | POA: Diagnosis not present

## 2022-06-04 ENCOUNTER — Other Ambulatory Visit: Payer: Self-pay | Admitting: Obstetrics and Gynecology

## 2022-06-04 ENCOUNTER — Encounter: Payer: Self-pay | Admitting: Obstetrics and Gynecology

## 2022-06-04 DIAGNOSIS — R928 Other abnormal and inconclusive findings on diagnostic imaging of breast: Secondary | ICD-10-CM

## 2022-06-26 ENCOUNTER — Ambulatory Visit
Admission: RE | Admit: 2022-06-26 | Discharge: 2022-06-26 | Disposition: A | Discharge status: Home / Self Care | Payer: Federal, State, Local not specified - PPO | Source: Ambulatory Visit | Attending: Obstetrics and Gynecology | Admitting: Obstetrics and Gynecology

## 2022-06-26 ENCOUNTER — Ambulatory Visit: Payer: No Typology Code available for payment source

## 2022-06-26 DIAGNOSIS — R928 Other abnormal and inconclusive findings on diagnostic imaging of breast: Secondary | ICD-10-CM | POA: Diagnosis not present

## 2022-08-27 DIAGNOSIS — L218 Other seborrheic dermatitis: Secondary | ICD-10-CM | POA: Diagnosis not present

## 2022-08-27 DIAGNOSIS — L718 Other rosacea: Secondary | ICD-10-CM | POA: Diagnosis not present

## 2022-08-27 DIAGNOSIS — L908 Other atrophic disorders of skin: Secondary | ICD-10-CM | POA: Diagnosis not present

## 2022-08-27 DIAGNOSIS — Z6828 Body mass index (BMI) 28.0-28.9, adult: Secondary | ICD-10-CM | POA: Diagnosis not present

## 2022-08-27 DIAGNOSIS — Z0000 Encounter for general adult medical examination without abnormal findings: Secondary | ICD-10-CM | POA: Diagnosis not present

## 2022-11-22 DIAGNOSIS — H04123 Dry eye syndrome of bilateral lacrimal glands: Secondary | ICD-10-CM | POA: Diagnosis not present

## 2022-12-25 DIAGNOSIS — F411 Generalized anxiety disorder: Secondary | ICD-10-CM | POA: Diagnosis not present

## 2022-12-25 DIAGNOSIS — I10 Essential (primary) hypertension: Secondary | ICD-10-CM | POA: Diagnosis not present

## 2022-12-25 DIAGNOSIS — Z6827 Body mass index (BMI) 27.0-27.9, adult: Secondary | ICD-10-CM | POA: Diagnosis not present

## 2022-12-25 DIAGNOSIS — J302 Other seasonal allergic rhinitis: Secondary | ICD-10-CM | POA: Diagnosis not present

## 2023-03-24 DIAGNOSIS — H04123 Dry eye syndrome of bilateral lacrimal glands: Secondary | ICD-10-CM | POA: Diagnosis not present

## 2023-05-22 DIAGNOSIS — R070 Pain in throat: Secondary | ICD-10-CM | POA: Diagnosis not present

## 2023-05-22 DIAGNOSIS — R051 Acute cough: Secondary | ICD-10-CM | POA: Diagnosis not present

## 2023-05-22 DIAGNOSIS — J209 Acute bronchitis, unspecified: Secondary | ICD-10-CM | POA: Diagnosis not present

## 2023-05-26 DIAGNOSIS — M62830 Muscle spasm of back: Secondary | ICD-10-CM | POA: Diagnosis not present

## 2023-05-26 DIAGNOSIS — R051 Acute cough: Secondary | ICD-10-CM | POA: Diagnosis not present

## 2023-05-26 DIAGNOSIS — R490 Dysphonia: Secondary | ICD-10-CM | POA: Diagnosis not present

## 2023-05-27 DIAGNOSIS — J208 Acute bronchitis due to other specified organisms: Secondary | ICD-10-CM | POA: Diagnosis not present

## 2023-05-27 DIAGNOSIS — J302 Other seasonal allergic rhinitis: Secondary | ICD-10-CM | POA: Diagnosis not present

## 2023-05-27 DIAGNOSIS — I10 Essential (primary) hypertension: Secondary | ICD-10-CM | POA: Diagnosis not present

## 2023-05-27 DIAGNOSIS — F411 Generalized anxiety disorder: Secondary | ICD-10-CM | POA: Diagnosis not present

## 2023-08-25 DIAGNOSIS — I10 Essential (primary) hypertension: Secondary | ICD-10-CM | POA: Diagnosis not present

## 2023-08-25 DIAGNOSIS — J208 Acute bronchitis due to other specified organisms: Secondary | ICD-10-CM | POA: Diagnosis not present

## 2023-08-25 DIAGNOSIS — M5431 Sciatica, right side: Secondary | ICD-10-CM | POA: Diagnosis not present

## 2023-08-25 DIAGNOSIS — F411 Generalized anxiety disorder: Secondary | ICD-10-CM | POA: Diagnosis not present

## 2023-08-25 DIAGNOSIS — J301 Allergic rhinitis due to pollen: Secondary | ICD-10-CM | POA: Diagnosis not present

## 2023-09-23 DIAGNOSIS — Z01411 Encounter for gynecological examination (general) (routine) with abnormal findings: Secondary | ICD-10-CM | POA: Diagnosis not present

## 2023-09-23 DIAGNOSIS — L293 Anogenital pruritus, unspecified: Secondary | ICD-10-CM | POA: Diagnosis not present

## 2023-09-23 DIAGNOSIS — Z113 Encounter for screening for infections with a predominantly sexual mode of transmission: Secondary | ICD-10-CM | POA: Diagnosis not present

## 2023-09-23 DIAGNOSIS — Z1231 Encounter for screening mammogram for malignant neoplasm of breast: Secondary | ICD-10-CM | POA: Diagnosis not present

## 2023-09-23 DIAGNOSIS — Z202 Contact with and (suspected) exposure to infections with a predominantly sexual mode of transmission: Secondary | ICD-10-CM | POA: Diagnosis not present

## 2023-10-08 DIAGNOSIS — H6122 Impacted cerumen, left ear: Secondary | ICD-10-CM | POA: Diagnosis not present

## 2023-10-08 DIAGNOSIS — H903 Sensorineural hearing loss, bilateral: Secondary | ICD-10-CM | POA: Diagnosis not present

## 2023-10-14 DIAGNOSIS — H9311 Tinnitus, right ear: Secondary | ICD-10-CM | POA: Diagnosis not present

## 2023-12-12 DIAGNOSIS — S46911A Strain of unspecified muscle, fascia and tendon at shoulder and upper arm level, right arm, initial encounter: Secondary | ICD-10-CM | POA: Diagnosis not present

## 2023-12-31 DIAGNOSIS — S46911D Strain of unspecified muscle, fascia and tendon at shoulder and upper arm level, right arm, subsequent encounter: Secondary | ICD-10-CM | POA: Diagnosis not present

## 2024-01-01 ENCOUNTER — Other Ambulatory Visit (HOSPITAL_COMMUNITY): Payer: Self-pay | Admitting: Orthopaedic Surgery

## 2024-01-01 DIAGNOSIS — G8929 Other chronic pain: Secondary | ICD-10-CM

## 2024-01-08 ENCOUNTER — Ambulatory Visit (HOSPITAL_COMMUNITY)
Admission: RE | Admit: 2024-01-08 | Discharge: 2024-01-08 | Disposition: A | Discharge status: Home / Self Care | Source: Ambulatory Visit | Attending: Orthopaedic Surgery | Admitting: Orthopaedic Surgery

## 2024-01-08 DIAGNOSIS — S46811A Strain of other muscles, fascia and tendons at shoulder and upper arm level, right arm, initial encounter: Secondary | ICD-10-CM | POA: Diagnosis not present

## 2024-01-08 DIAGNOSIS — G8929 Other chronic pain: Secondary | ICD-10-CM | POA: Diagnosis not present

## 2024-01-08 DIAGNOSIS — M25511 Pain in right shoulder: Secondary | ICD-10-CM | POA: Insufficient documentation

## 2024-01-08 DIAGNOSIS — M129 Arthropathy, unspecified: Secondary | ICD-10-CM | POA: Diagnosis not present

## 2024-01-08 DIAGNOSIS — M7581 Other shoulder lesions, right shoulder: Secondary | ICD-10-CM | POA: Diagnosis not present

## 2024-01-14 DIAGNOSIS — S46911D Strain of unspecified muscle, fascia and tendon at shoulder and upper arm level, right arm, subsequent encounter: Secondary | ICD-10-CM | POA: Diagnosis not present

## 2024-01-25 DIAGNOSIS — M75121 Complete rotator cuff tear or rupture of right shoulder, not specified as traumatic: Secondary | ICD-10-CM | POA: Diagnosis not present

## 2024-01-25 DIAGNOSIS — S46111A Strain of muscle, fascia and tendon of long head of biceps, right arm, initial encounter: Secondary | ICD-10-CM | POA: Diagnosis not present

## 2024-01-25 DIAGNOSIS — S43431A Superior glenoid labrum lesion of right shoulder, initial encounter: Secondary | ICD-10-CM | POA: Diagnosis not present

## 2024-01-25 DIAGNOSIS — S46011A Strain of muscle(s) and tendon(s) of the rotator cuff of right shoulder, initial encounter: Secondary | ICD-10-CM | POA: Diagnosis not present

## 2024-01-25 DIAGNOSIS — M7541 Impingement syndrome of right shoulder: Secondary | ICD-10-CM | POA: Diagnosis not present

## 2024-01-25 DIAGNOSIS — Y999 Unspecified external cause status: Secondary | ICD-10-CM | POA: Diagnosis not present

## 2024-01-25 DIAGNOSIS — X58XXXA Exposure to other specified factors, initial encounter: Secondary | ICD-10-CM | POA: Diagnosis not present

## 2024-01-25 DIAGNOSIS — M24111 Other articular cartilage disorders, right shoulder: Secondary | ICD-10-CM | POA: Diagnosis not present

## 2024-01-25 DIAGNOSIS — M7501 Adhesive capsulitis of right shoulder: Secondary | ICD-10-CM | POA: Diagnosis not present

## 2024-01-25 DIAGNOSIS — M7581 Other shoulder lesions, right shoulder: Secondary | ICD-10-CM | POA: Diagnosis not present

## 2024-01-25 DIAGNOSIS — M7521 Bicipital tendinitis, right shoulder: Secondary | ICD-10-CM | POA: Diagnosis not present

## 2024-01-25 DIAGNOSIS — G8918 Other acute postprocedural pain: Secondary | ICD-10-CM | POA: Diagnosis not present

## 2024-01-25 DIAGNOSIS — M25811 Other specified joint disorders, right shoulder: Secondary | ICD-10-CM | POA: Diagnosis not present

## 2024-01-25 DIAGNOSIS — M19011 Primary osteoarthritis, right shoulder: Secondary | ICD-10-CM | POA: Diagnosis not present

## 2024-01-28 DIAGNOSIS — S46111D Strain of muscle, fascia and tendon of long head of biceps, right arm, subsequent encounter: Secondary | ICD-10-CM | POA: Diagnosis not present

## 2024-01-28 DIAGNOSIS — S46011D Strain of muscle(s) and tendon(s) of the rotator cuff of right shoulder, subsequent encounter: Secondary | ICD-10-CM | POA: Diagnosis not present

## 2024-01-28 DIAGNOSIS — M6281 Muscle weakness (generalized): Secondary | ICD-10-CM | POA: Diagnosis not present

## 2024-01-28 DIAGNOSIS — M25611 Stiffness of right shoulder, not elsewhere classified: Secondary | ICD-10-CM | POA: Diagnosis not present

## 2024-02-01 DIAGNOSIS — M25611 Stiffness of right shoulder, not elsewhere classified: Secondary | ICD-10-CM | POA: Diagnosis not present

## 2024-02-01 DIAGNOSIS — M6281 Muscle weakness (generalized): Secondary | ICD-10-CM | POA: Diagnosis not present

## 2024-02-01 DIAGNOSIS — S46011D Strain of muscle(s) and tendon(s) of the rotator cuff of right shoulder, subsequent encounter: Secondary | ICD-10-CM | POA: Diagnosis not present

## 2024-02-01 DIAGNOSIS — S46111D Strain of muscle, fascia and tendon of long head of biceps, right arm, subsequent encounter: Secondary | ICD-10-CM | POA: Diagnosis not present

## 2024-02-02 DIAGNOSIS — M19011 Primary osteoarthritis, right shoulder: Secondary | ICD-10-CM | POA: Diagnosis not present

## 2024-02-04 DIAGNOSIS — M6281 Muscle weakness (generalized): Secondary | ICD-10-CM | POA: Diagnosis not present

## 2024-02-04 DIAGNOSIS — S46111D Strain of muscle, fascia and tendon of long head of biceps, right arm, subsequent encounter: Secondary | ICD-10-CM | POA: Diagnosis not present

## 2024-02-04 DIAGNOSIS — M25611 Stiffness of right shoulder, not elsewhere classified: Secondary | ICD-10-CM | POA: Diagnosis not present

## 2024-02-04 DIAGNOSIS — S46011D Strain of muscle(s) and tendon(s) of the rotator cuff of right shoulder, subsequent encounter: Secondary | ICD-10-CM | POA: Diagnosis not present

## 2024-02-08 DIAGNOSIS — S46011D Strain of muscle(s) and tendon(s) of the rotator cuff of right shoulder, subsequent encounter: Secondary | ICD-10-CM | POA: Diagnosis not present

## 2024-02-08 DIAGNOSIS — M25611 Stiffness of right shoulder, not elsewhere classified: Secondary | ICD-10-CM | POA: Diagnosis not present

## 2024-02-08 DIAGNOSIS — S46111D Strain of muscle, fascia and tendon of long head of biceps, right arm, subsequent encounter: Secondary | ICD-10-CM | POA: Diagnosis not present

## 2024-02-08 DIAGNOSIS — M6281 Muscle weakness (generalized): Secondary | ICD-10-CM | POA: Diagnosis not present

## 2024-02-11 DIAGNOSIS — S46111D Strain of muscle, fascia and tendon of long head of biceps, right arm, subsequent encounter: Secondary | ICD-10-CM | POA: Diagnosis not present

## 2024-02-11 DIAGNOSIS — S46011D Strain of muscle(s) and tendon(s) of the rotator cuff of right shoulder, subsequent encounter: Secondary | ICD-10-CM | POA: Diagnosis not present

## 2024-02-11 DIAGNOSIS — M25611 Stiffness of right shoulder, not elsewhere classified: Secondary | ICD-10-CM | POA: Diagnosis not present

## 2024-02-11 DIAGNOSIS — M6281 Muscle weakness (generalized): Secondary | ICD-10-CM | POA: Diagnosis not present

## 2024-02-17 DIAGNOSIS — M6281 Muscle weakness (generalized): Secondary | ICD-10-CM | POA: Diagnosis not present

## 2024-02-17 DIAGNOSIS — S46011D Strain of muscle(s) and tendon(s) of the rotator cuff of right shoulder, subsequent encounter: Secondary | ICD-10-CM | POA: Diagnosis not present

## 2024-02-17 DIAGNOSIS — S46111D Strain of muscle, fascia and tendon of long head of biceps, right arm, subsequent encounter: Secondary | ICD-10-CM | POA: Diagnosis not present

## 2024-02-17 DIAGNOSIS — M25611 Stiffness of right shoulder, not elsewhere classified: Secondary | ICD-10-CM | POA: Diagnosis not present

## 2024-02-18 DIAGNOSIS — M6281 Muscle weakness (generalized): Secondary | ICD-10-CM | POA: Diagnosis not present

## 2024-02-18 DIAGNOSIS — S46011D Strain of muscle(s) and tendon(s) of the rotator cuff of right shoulder, subsequent encounter: Secondary | ICD-10-CM | POA: Diagnosis not present

## 2024-02-18 DIAGNOSIS — M25611 Stiffness of right shoulder, not elsewhere classified: Secondary | ICD-10-CM | POA: Diagnosis not present

## 2024-02-18 DIAGNOSIS — S46111D Strain of muscle, fascia and tendon of long head of biceps, right arm, subsequent encounter: Secondary | ICD-10-CM | POA: Diagnosis not present

## 2024-02-22 DIAGNOSIS — S46011D Strain of muscle(s) and tendon(s) of the rotator cuff of right shoulder, subsequent encounter: Secondary | ICD-10-CM | POA: Diagnosis not present

## 2024-02-22 DIAGNOSIS — S46111D Strain of muscle, fascia and tendon of long head of biceps, right arm, subsequent encounter: Secondary | ICD-10-CM | POA: Diagnosis not present

## 2024-02-22 DIAGNOSIS — M25611 Stiffness of right shoulder, not elsewhere classified: Secondary | ICD-10-CM | POA: Diagnosis not present

## 2024-02-22 DIAGNOSIS — M6281 Muscle weakness (generalized): Secondary | ICD-10-CM | POA: Diagnosis not present

## 2024-02-25 DIAGNOSIS — S46011D Strain of muscle(s) and tendon(s) of the rotator cuff of right shoulder, subsequent encounter: Secondary | ICD-10-CM | POA: Diagnosis not present

## 2024-02-25 DIAGNOSIS — M6281 Muscle weakness (generalized): Secondary | ICD-10-CM | POA: Diagnosis not present

## 2024-02-25 DIAGNOSIS — M25611 Stiffness of right shoulder, not elsewhere classified: Secondary | ICD-10-CM | POA: Diagnosis not present

## 2024-02-25 DIAGNOSIS — S46111D Strain of muscle, fascia and tendon of long head of biceps, right arm, subsequent encounter: Secondary | ICD-10-CM | POA: Diagnosis not present

## 2024-02-29 DIAGNOSIS — M25611 Stiffness of right shoulder, not elsewhere classified: Secondary | ICD-10-CM | POA: Diagnosis not present

## 2024-02-29 DIAGNOSIS — M6281 Muscle weakness (generalized): Secondary | ICD-10-CM | POA: Diagnosis not present

## 2024-02-29 DIAGNOSIS — S46011D Strain of muscle(s) and tendon(s) of the rotator cuff of right shoulder, subsequent encounter: Secondary | ICD-10-CM | POA: Diagnosis not present

## 2024-02-29 DIAGNOSIS — S46111D Strain of muscle, fascia and tendon of long head of biceps, right arm, subsequent encounter: Secondary | ICD-10-CM | POA: Diagnosis not present

## 2024-03-01 DIAGNOSIS — Z6827 Body mass index (BMI) 27.0-27.9, adult: Secondary | ICD-10-CM | POA: Diagnosis not present

## 2024-03-01 DIAGNOSIS — Z0000 Encounter for general adult medical examination without abnormal findings: Secondary | ICD-10-CM | POA: Diagnosis not present

## 2024-03-04 DIAGNOSIS — M25611 Stiffness of right shoulder, not elsewhere classified: Secondary | ICD-10-CM | POA: Diagnosis not present

## 2024-03-04 DIAGNOSIS — S46111D Strain of muscle, fascia and tendon of long head of biceps, right arm, subsequent encounter: Secondary | ICD-10-CM | POA: Diagnosis not present

## 2024-03-04 DIAGNOSIS — M6281 Muscle weakness (generalized): Secondary | ICD-10-CM | POA: Diagnosis not present

## 2024-03-04 DIAGNOSIS — S46011D Strain of muscle(s) and tendon(s) of the rotator cuff of right shoulder, subsequent encounter: Secondary | ICD-10-CM | POA: Diagnosis not present

## 2024-03-08 DIAGNOSIS — S46011D Strain of muscle(s) and tendon(s) of the rotator cuff of right shoulder, subsequent encounter: Secondary | ICD-10-CM | POA: Diagnosis not present

## 2024-03-08 DIAGNOSIS — S46111D Strain of muscle, fascia and tendon of long head of biceps, right arm, subsequent encounter: Secondary | ICD-10-CM | POA: Diagnosis not present

## 2024-03-08 DIAGNOSIS — M6281 Muscle weakness (generalized): Secondary | ICD-10-CM | POA: Diagnosis not present

## 2024-03-08 DIAGNOSIS — M25611 Stiffness of right shoulder, not elsewhere classified: Secondary | ICD-10-CM | POA: Diagnosis not present

## 2024-03-10 DIAGNOSIS — S46111D Strain of muscle, fascia and tendon of long head of biceps, right arm, subsequent encounter: Secondary | ICD-10-CM | POA: Diagnosis not present

## 2024-03-10 DIAGNOSIS — M25611 Stiffness of right shoulder, not elsewhere classified: Secondary | ICD-10-CM | POA: Diagnosis not present

## 2024-03-10 DIAGNOSIS — M6281 Muscle weakness (generalized): Secondary | ICD-10-CM | POA: Diagnosis not present

## 2024-03-10 DIAGNOSIS — S46011D Strain of muscle(s) and tendon(s) of the rotator cuff of right shoulder, subsequent encounter: Secondary | ICD-10-CM | POA: Diagnosis not present

## 2024-03-14 DIAGNOSIS — S46111D Strain of muscle, fascia and tendon of long head of biceps, right arm, subsequent encounter: Secondary | ICD-10-CM | POA: Diagnosis not present

## 2024-03-14 DIAGNOSIS — S46011D Strain of muscle(s) and tendon(s) of the rotator cuff of right shoulder, subsequent encounter: Secondary | ICD-10-CM | POA: Diagnosis not present

## 2024-03-14 DIAGNOSIS — M6281 Muscle weakness (generalized): Secondary | ICD-10-CM | POA: Diagnosis not present

## 2024-03-14 DIAGNOSIS — M25611 Stiffness of right shoulder, not elsewhere classified: Secondary | ICD-10-CM | POA: Diagnosis not present

## 2024-03-16 DIAGNOSIS — S46111D Strain of muscle, fascia and tendon of long head of biceps, right arm, subsequent encounter: Secondary | ICD-10-CM | POA: Diagnosis not present

## 2024-03-16 DIAGNOSIS — S46011D Strain of muscle(s) and tendon(s) of the rotator cuff of right shoulder, subsequent encounter: Secondary | ICD-10-CM | POA: Diagnosis not present

## 2024-03-16 DIAGNOSIS — M25611 Stiffness of right shoulder, not elsewhere classified: Secondary | ICD-10-CM | POA: Diagnosis not present

## 2024-03-16 DIAGNOSIS — M6281 Muscle weakness (generalized): Secondary | ICD-10-CM | POA: Diagnosis not present

## 2024-03-21 DIAGNOSIS — S46111D Strain of muscle, fascia and tendon of long head of biceps, right arm, subsequent encounter: Secondary | ICD-10-CM | POA: Diagnosis not present

## 2024-03-21 DIAGNOSIS — S46011D Strain of muscle(s) and tendon(s) of the rotator cuff of right shoulder, subsequent encounter: Secondary | ICD-10-CM | POA: Diagnosis not present

## 2024-03-23 DIAGNOSIS — S46011D Strain of muscle(s) and tendon(s) of the rotator cuff of right shoulder, subsequent encounter: Secondary | ICD-10-CM | POA: Diagnosis not present

## 2024-03-23 DIAGNOSIS — S46111D Strain of muscle, fascia and tendon of long head of biceps, right arm, subsequent encounter: Secondary | ICD-10-CM | POA: Diagnosis not present

## 2024-03-29 DIAGNOSIS — S46011D Strain of muscle(s) and tendon(s) of the rotator cuff of right shoulder, subsequent encounter: Secondary | ICD-10-CM | POA: Diagnosis not present

## 2024-03-29 DIAGNOSIS — S46111D Strain of muscle, fascia and tendon of long head of biceps, right arm, subsequent encounter: Secondary | ICD-10-CM | POA: Diagnosis not present

## 2024-03-31 DIAGNOSIS — S46111D Strain of muscle, fascia and tendon of long head of biceps, right arm, subsequent encounter: Secondary | ICD-10-CM | POA: Diagnosis not present

## 2024-03-31 DIAGNOSIS — S46011D Strain of muscle(s) and tendon(s) of the rotator cuff of right shoulder, subsequent encounter: Secondary | ICD-10-CM | POA: Diagnosis not present

## 2024-04-04 DIAGNOSIS — S46111D Strain of muscle, fascia and tendon of long head of biceps, right arm, subsequent encounter: Secondary | ICD-10-CM | POA: Diagnosis not present

## 2024-04-04 DIAGNOSIS — S46011D Strain of muscle(s) and tendon(s) of the rotator cuff of right shoulder, subsequent encounter: Secondary | ICD-10-CM | POA: Diagnosis not present

## 2024-04-07 DIAGNOSIS — S46111D Strain of muscle, fascia and tendon of long head of biceps, right arm, subsequent encounter: Secondary | ICD-10-CM | POA: Diagnosis not present

## 2024-04-07 DIAGNOSIS — S46011D Strain of muscle(s) and tendon(s) of the rotator cuff of right shoulder, subsequent encounter: Secondary | ICD-10-CM | POA: Diagnosis not present

## 2024-04-11 DIAGNOSIS — S46011D Strain of muscle(s) and tendon(s) of the rotator cuff of right shoulder, subsequent encounter: Secondary | ICD-10-CM | POA: Diagnosis not present

## 2024-04-11 DIAGNOSIS — S46111D Strain of muscle, fascia and tendon of long head of biceps, right arm, subsequent encounter: Secondary | ICD-10-CM | POA: Diagnosis not present

## 2024-04-13 DIAGNOSIS — S46111D Strain of muscle, fascia and tendon of long head of biceps, right arm, subsequent encounter: Secondary | ICD-10-CM | POA: Diagnosis not present

## 2024-04-13 DIAGNOSIS — S46011D Strain of muscle(s) and tendon(s) of the rotator cuff of right shoulder, subsequent encounter: Secondary | ICD-10-CM | POA: Diagnosis not present

## 2024-04-18 DIAGNOSIS — S46111D Strain of muscle, fascia and tendon of long head of biceps, right arm, subsequent encounter: Secondary | ICD-10-CM | POA: Diagnosis not present

## 2024-04-18 DIAGNOSIS — S46011D Strain of muscle(s) and tendon(s) of the rotator cuff of right shoulder, subsequent encounter: Secondary | ICD-10-CM | POA: Diagnosis not present

## 2024-04-21 DIAGNOSIS — S46111D Strain of muscle, fascia and tendon of long head of biceps, right arm, subsequent encounter: Secondary | ICD-10-CM | POA: Diagnosis not present

## 2024-04-21 DIAGNOSIS — S46011D Strain of muscle(s) and tendon(s) of the rotator cuff of right shoulder, subsequent encounter: Secondary | ICD-10-CM | POA: Diagnosis not present

## 2024-04-26 DIAGNOSIS — S46011D Strain of muscle(s) and tendon(s) of the rotator cuff of right shoulder, subsequent encounter: Secondary | ICD-10-CM | POA: Diagnosis not present

## 2024-04-26 DIAGNOSIS — S46111D Strain of muscle, fascia and tendon of long head of biceps, right arm, subsequent encounter: Secondary | ICD-10-CM | POA: Diagnosis not present

## 2024-04-28 DIAGNOSIS — S46111D Strain of muscle, fascia and tendon of long head of biceps, right arm, subsequent encounter: Secondary | ICD-10-CM | POA: Diagnosis not present

## 2024-04-28 DIAGNOSIS — H61893 Other specified disorders of external ear, bilateral: Secondary | ICD-10-CM | POA: Diagnosis not present

## 2024-04-28 DIAGNOSIS — S46011D Strain of muscle(s) and tendon(s) of the rotator cuff of right shoulder, subsequent encounter: Secondary | ICD-10-CM | POA: Diagnosis not present

## 2024-05-02 DIAGNOSIS — S46011D Strain of muscle(s) and tendon(s) of the rotator cuff of right shoulder, subsequent encounter: Secondary | ICD-10-CM | POA: Diagnosis not present

## 2024-05-02 DIAGNOSIS — S46111D Strain of muscle, fascia and tendon of long head of biceps, right arm, subsequent encounter: Secondary | ICD-10-CM | POA: Diagnosis not present

## 2024-05-11 DIAGNOSIS — S46111D Strain of muscle, fascia and tendon of long head of biceps, right arm, subsequent encounter: Secondary | ICD-10-CM | POA: Diagnosis not present

## 2024-05-11 DIAGNOSIS — S46011D Strain of muscle(s) and tendon(s) of the rotator cuff of right shoulder, subsequent encounter: Secondary | ICD-10-CM | POA: Diagnosis not present
